# Patient Record
Sex: Female | Born: 1965 | Race: White | Hispanic: No | Marital: Married | Smoking: Never smoker
Health system: Southern US, Community
[De-identification: ages and names within clinical notes are randomized; demographics above are authoritative.]

## PROBLEM LIST (undated history)

## (undated) DIAGNOSIS — R7309 Other abnormal glucose: Secondary | ICD-10-CM

## (undated) DIAGNOSIS — T8859XA Other complications of anesthesia, initial encounter: Secondary | ICD-10-CM

## (undated) DIAGNOSIS — K589 Irritable bowel syndrome without diarrhea: Secondary | ICD-10-CM

## (undated) DIAGNOSIS — Z973 Presence of spectacles and contact lenses: Secondary | ICD-10-CM

## (undated) DIAGNOSIS — K219 Gastro-esophageal reflux disease without esophagitis: Secondary | ICD-10-CM

## (undated) DIAGNOSIS — Z8489 Family history of other specified conditions: Secondary | ICD-10-CM

## (undated) DIAGNOSIS — H269 Unspecified cataract: Secondary | ICD-10-CM

## (undated) DIAGNOSIS — C801 Malignant (primary) neoplasm, unspecified: Secondary | ICD-10-CM

## (undated) DIAGNOSIS — R112 Nausea with vomiting, unspecified: Secondary | ICD-10-CM

## (undated) DIAGNOSIS — T7840XA Allergy, unspecified, initial encounter: Secondary | ICD-10-CM

## (undated) DIAGNOSIS — F419 Anxiety disorder, unspecified: Secondary | ICD-10-CM

## (undated) DIAGNOSIS — M1990 Unspecified osteoarthritis, unspecified site: Secondary | ICD-10-CM

## (undated) DIAGNOSIS — Z9889 Other specified postprocedural states: Secondary | ICD-10-CM

## (undated) DIAGNOSIS — I10 Essential (primary) hypertension: Secondary | ICD-10-CM

## (undated) DIAGNOSIS — T4145XA Adverse effect of unspecified anesthetic, initial encounter: Secondary | ICD-10-CM

## (undated) HISTORY — PX: COLONOSCOPY: SHX174

## (undated) HISTORY — PX: WISDOM TOOTH EXTRACTION: SHX21

## (undated) HISTORY — DX: Irritable bowel syndrome, unspecified: K58.9

## (undated) HISTORY — PX: CATARACT EXTRACTION, BILATERAL: SHX1313

## (undated) HISTORY — DX: Allergy, unspecified, initial encounter: T78.40XA

## (undated) HISTORY — DX: Unspecified cataract: H26.9

## (undated) HISTORY — DX: Other abnormal glucose: R73.09

---
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
## 1989-08-15 HISTORY — PX: CHOLECYSTECTOMY: SHX55

## 1999-11-01 ENCOUNTER — Encounter: Admission: RE | Admit: 1999-11-01 | Discharge: 1999-11-01 | Payer: Self-pay | Admitting: Family Medicine

## 1999-11-01 ENCOUNTER — Encounter: Payer: Self-pay | Admitting: Family Medicine

## 2000-04-25 ENCOUNTER — Other Ambulatory Visit: Admission: RE | Admit: 2000-04-25 | Discharge: 2000-04-25 | Payer: Self-pay | Admitting: Gynecology

## 2000-09-20 ENCOUNTER — Encounter: Admission: RE | Admit: 2000-09-20 | Discharge: 2000-09-20 | Payer: Self-pay | Admitting: Gynecology

## 2000-09-20 ENCOUNTER — Encounter: Payer: Self-pay | Admitting: Gynecology

## 2001-05-28 ENCOUNTER — Other Ambulatory Visit: Admission: RE | Admit: 2001-05-28 | Discharge: 2001-05-28 | Payer: Self-pay | Admitting: Gynecology

## 2002-06-24 ENCOUNTER — Other Ambulatory Visit: Admission: RE | Admit: 2002-06-24 | Discharge: 2002-06-24 | Payer: Self-pay | Admitting: Gynecology

## 2003-07-09 ENCOUNTER — Other Ambulatory Visit: Admission: RE | Admit: 2003-07-09 | Discharge: 2003-07-09 | Payer: Self-pay | Admitting: Gynecology

## 2004-01-06 ENCOUNTER — Encounter: Admission: RE | Admit: 2004-01-06 | Discharge: 2004-01-06 | Payer: Self-pay | Admitting: Family Medicine

## 2004-01-20 ENCOUNTER — Encounter: Admission: RE | Admit: 2004-01-20 | Discharge: 2004-01-20 | Payer: Self-pay | Admitting: Family Medicine

## 2004-07-22 ENCOUNTER — Other Ambulatory Visit: Admission: RE | Admit: 2004-07-22 | Discharge: 2004-07-22 | Payer: Self-pay | Admitting: Gynecology

## 2005-08-15 HISTORY — PX: VAGINAL HYSTERECTOMY: SUR661

## 2005-10-13 ENCOUNTER — Other Ambulatory Visit: Admission: RE | Admit: 2005-10-13 | Discharge: 2005-10-13 | Payer: Self-pay | Admitting: Gynecology

## 2005-10-25 ENCOUNTER — Encounter: Admission: RE | Admit: 2005-10-25 | Discharge: 2005-10-25 | Payer: Self-pay | Admitting: Gynecology

## 2006-01-16 ENCOUNTER — Inpatient Hospital Stay (HOSPITAL_COMMUNITY): Admission: RE | Admit: 2006-01-16 | Discharge: 2006-01-18 | Payer: Self-pay | Admitting: Gynecology

## 2006-01-16 ENCOUNTER — Encounter (INDEPENDENT_AMBULATORY_CARE_PROVIDER_SITE_OTHER): Payer: Self-pay | Admitting: *Deleted

## 2006-12-14 ENCOUNTER — Encounter: Admission: RE | Admit: 2006-12-14 | Discharge: 2006-12-14 | Payer: Self-pay | Admitting: Gynecology

## 2006-12-18 ENCOUNTER — Other Ambulatory Visit: Admission: RE | Admit: 2006-12-18 | Discharge: 2006-12-18 | Payer: Self-pay | Admitting: Gynecology

## 2008-02-11 ENCOUNTER — Encounter: Admission: RE | Admit: 2008-02-11 | Discharge: 2008-02-11 | Payer: Self-pay | Admitting: Gynecology

## 2008-05-30 ENCOUNTER — Ambulatory Visit: Payer: Self-pay | Admitting: Internal Medicine

## 2008-05-30 DIAGNOSIS — R05 Cough: Secondary | ICD-10-CM

## 2008-05-30 DIAGNOSIS — R059 Cough, unspecified: Secondary | ICD-10-CM | POA: Insufficient documentation

## 2008-05-30 DIAGNOSIS — J309 Allergic rhinitis, unspecified: Secondary | ICD-10-CM

## 2008-05-30 HISTORY — DX: Cough, unspecified: R05.9

## 2008-05-30 HISTORY — DX: Allergic rhinitis, unspecified: J30.9

## 2009-03-26 ENCOUNTER — Encounter: Admission: RE | Admit: 2009-03-26 | Discharge: 2009-03-26 | Payer: Self-pay | Admitting: Gynecology

## 2010-04-21 ENCOUNTER — Encounter: Admission: RE | Admit: 2010-04-21 | Discharge: 2010-04-21 | Payer: Self-pay | Admitting: Gynecology

## 2010-05-04 ENCOUNTER — Encounter: Admission: RE | Admit: 2010-05-04 | Discharge: 2010-05-04 | Payer: Self-pay | Admitting: Gynecology

## 2010-12-31 NOTE — Discharge Summary (Signed)
NAME:  Judy Jones, Judy Jones NO.:  [id number]   MEDICAL RECORD NO.:  [id number]          PATIENT TYPE:  INP   LOCATION:  9302                          FACILITY:  [facility]   PHYSICIAN:  Gretta Cool, M.D. DATE OF BIRTH:  1966/01/03   DATE OF ADMISSION:  01/16/2006  DATE OF DISCHARGE:  01/18/2006                                 DISCHARGE SUMMARY   HISTORY OF PRESENT ILLNESS:  Judy Jones is a 45 year old female, gravida  2, para 2, with a history of a previous cesarean section delivery with her  first child.  She had a VBAC with her second child and had extensive tearing  requiring forceps delivery.  She had a much longer and difficult recovery by  her history.  This was done elsewhere. She now has had progressively severe  pelvic support issues with descent of the cervix to the introitus.  She has  a large interior cystocele and large fascial defect.  She denies urinary  incontinence but does report difficult evacuation of the bowel requiring  splinting.  She has a large fascial detachment with enterocele and rectocele  grade 3 with sphincters intact.  Pelvic neurologic findings seem to be  intact as well.  She is now admitted for symptomatic pelvic relaxation for  definitive therapy by vaginal hysterectomy, anterior colporrhaphy, and  repair of her central fascial defect post enterocele repairs, specific and  cardinal uterosacral colposuspension.  Risks and benefits have been  discussed with the patient. She accepts these procedures.   PHYSICAL EXAMINATION:  CHEST:  Clear to auscultation and percussion.  HEART: Regular rate and rhythm without murmur, gallop, cardiac enlargement.  BREASTS:  Without masses, no nipple discharge.  ABDOMEN:  Soft without masses or organomegaly.  Moderate panniculus.  PELVIC:  External genitalia within normal limits for female. Vagina has  widening of the genital hiatus with large cystocele that bulges into the  introitus with straining.  She  also has a rectocele that bulges as well  grade 3.  She has severe fascial detachment with a large enterocele and  rectocele.  The sphincter remains intact.  The uterus descends to the  introitus with traction.  Rectovaginal exam confirms.   IMPRESSION:  1.  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  2.  Severe fascial defect.  3.  Irritable bowel syndrome with chronic constipation.   PLAN:  Vaginal hysterectomy, anterior and posterior colporrhaphy with slight  specific repairs of fascial detachment and cardinal uterosacral  colposuspension.  Risks and benefits have been discussed with the patient,  and she accepts these procedures.   LABORATORY DATA:  Admission hemoglobin was 13.1, hematocrit 39.4, white  count 8.5.  On the first postoperative day, hemoglobin was 11.8, hematocrit  34.6.  The remainder of her preoperative lab work was within normal limits.  Urine pregnancy test was negative.   Her pathology report revealed cervix with chronic cervicitis with squamous  metaplasia.  No intraepithelial lesion identified, benign cervical polyp,  endometrium secretory endometrium no hyperplasia or malignancy  identified,  myometrium no malignancy identified.   HOSPITAL COURSE:  The patient underwent the above-named procedures under  general anesthesia.  The procedures were completed without any  complications, and the patient was returned to the recovery room in  excellent condition.  A banana suprapubic Cystocath was placed.  Her  postoperative course was complicated with nausea which was improved with  medication.  On the day of discharge, she was still not emptying and was  discharged with the suprapubic catheter intact.  The patient was instructed  to measure intake and output and will return to the office when the  residuals are less than 100 mL.   Her postoperative instructions included no heavy lifting or  straining, no  vaginal entrance, increased ambulation as tolerated.  She is to call for any  fever of over 100.5 or failure of daily improvement.   DIET:  Regular.   MEDICATIONS:  1.  Tylox 1-2 tablets every 6-8 hours p.r.n. discomfort.  2.  Dulcolax suppository prior to discharge and Fleet's enema if no bowel      movement.   She is to return to the office to remove the catheter when she is emptying  well and in 2 weeks for her postoperative exam.   CONDITION ON DISCHARGE:  Excellent.   FINAL DIAGNOSES:  1.  Pelvic organ prolapse with grade 3 cystocele, rectocele, and grade 3      uterine descensus.  2.  Severe fascial detachment posteriorly.   PROCEDURES:  Vaginal hysterectomy, anterior and posterior repairs, and  cardinal uterosacral colposuspension under general anesthesia.      Matt Holmes, N.P.    ______________________________  Gretta Cool, M.D.    EMK/MEDQ  D:  02/13/2006  T:  02/13/2006  Job:  [id number]

## 2010-12-31 NOTE — Op Note (Signed)
NAME:  Judy Jones, Judy Jones NO.:  [id number]   MEDICAL RECORD NO.:  [id number]          PATIENT TYPE:  AMB   LOCATION:  SDC                           FACILITY:  [facility]   PHYSICIAN:  Judy Jones, M.D. DATE OF BIRTH:  [birth year]   DATE OF PROCEDURE:  DATE OF DISCHARGE:                                 OPERATIVE REPORT   SURGEON:  Dr. Beather Arbour   ASSISTANT:  Dr. Jeanine Luz   ANESTHESIA:  General orotracheal.   PREOPERATIVE DIAGNOSES:  1.  Pelvic organ prolapse with grade 3 cystocele, rectocele, and grade 3      uterine descensus.  2.  Severe fascial detachment posteriorly.   PROCEDURE:  Vaginal hysterectomy, anterior and posterior repairs, cardinal  uterosacral colposuspension.   DESCRIPTION OF PROCEDURE:  Under excellent general anesthesia with the  patient prepped and draped in Allen stirrups the cervix was grasped with a  single tooth tenaculum and the uterus pulled down into view.  The mucosa was  then injected with 0.5% Xylocaine with epinephrine 1:200.  The mucosa was  then incised and pushed off the lower segment.  The cul-de-sac was then  entered.  The cardinal and uterosacral ligaments were then progressively  clamped, cut, sutured, and tied with 0 Vicryl.  The bladder was then pushed  off the lower segment and the vesicovaginal plica opened.  The bladder was  then placed beneath the bladder.  The uterine vessels were then clamped,  cut, sutured, and tied with 0 Vicryl.  At this point the uterus was inverted  and the adnexal pedicles clamped across.  The uterus was then excised.  The  pedicles were then doubly ligated with first a free tie of 0 Vicryl and then  a suture of 0 Vicryl.  At this point the pedicles were all dry.  The  pelvoperitoneum was then closed with a running suture of #0 Monocryl from  anterior peritoneum to lateral pedicles through cul-de-sac peritoneum.  The  anterior vaginal wall repair was then undertaken.  The mucosa was  grasped  with Allis clamps and undermined to near the urethral opening.  The mucosa  was then reflected from the underlying endopelvic fascia.  The large  endopelvic fascial defect was then first plicated with pursestring sutures  and then approximated with mattress sutures of 2-0 Vicryl.  Since she has  not been continent of urine no attempt was made to elevate her vesical neck.  At this point the mucosa was trimmed and the mucosa closed with a running  subcuticular closure of 2-0 Vicryl.  At this point the bladder pillars were  secured to the cardinal uterosacral complex.  At this point attention was  turned to the posterior repair.  Mucosa was grasped at the introitus and  dissected to the apex of the vaginal cuff.  The mucosa was then reflected  from the perirectal fascia.  A large fascial detachment was found at the  apex of the vaginal cuff.  A cardiology uterosacral colposuspension was  performed using 0 Novofil.  The complex was secured by interrupted sutures  of 0 Novofil  to the perirectal fascia and the fascia pulled all the way to  the apex of the vaginal cuff.  At this point the central portion of the  vaginal cuff was secured to the anterior vaginal fascia.  The cuff was then  closed and the mucosa trimmed.  After the site specific repair was performed  a plication of the levator fascia was performed all the way from the apex of  the vaginal cuff to the introitus.  The mucosa was then trimmed and  approximated from the apex of the cuff to the introitus.  The perineal body  muscles were then plicated in the midline and the perineal body  rebuilt.  Mucosa was closed with a subcuticular closure.  At this point the  bladder was filled with approximately 400 mL.  A banana suprapubic Cystocath  was placed and secured with 3-0 silk.  At this point procedure terminated  without complication.  Patient returned to recovery room in excellent  condition.            ______________________________  Judy Jones, M.D.     CWL/MEDQ  D:  01/16/2006  T:  01/16/2006  Job:  [id number]

## 2010-12-31 NOTE — H&P (Signed)
NAME:  Judy Jones, Judy Jones NO.:  [id number]   MEDICAL RECORD NO.:  [id number]          PATIENT TYPE:  AMB   LOCATION:  SDC                           FACILITY:  [facility]   PHYSICIAN:  Gretta Cool, M.D. DATE OF BIRTH:  18-Dec-1965   DATE OF ADMISSION:  01/16/2006  DATE OF DISCHARGE:                                HISTORY & PHYSICAL   CHIEF COMPLAINT:  Pelvic support problems.   HISTORY OF PRESENT ILLNESS:  A 45 year old, G2, P2 with history of cesarean  section delivery with her first child.  Her second delivery was vaginal.  She had extensive tearing and required forceps delivery.  She had a much  longer and difficult recovery, by her history, than the cesarean section she  had with her first delivery.  She was delivered elsewhere.  She now has had  progressively severe pelvic support issues with descent of her cervix to the  introitus.  She also has a large, anterior cystocele and large fascial  defect.  She denies any incontinence.  She also has difficult evacuation of  her bowel requiring splinting to achieve evacuation.  Often, she has very  slow bowel activity often only having bowel movements once a week.  She has  a large, fascial detachment with enterocele and rectocele grade 3.  Her anal  sphincter is intact.  Her pelvic neurologic findings also seem to be intact.  She is now admitted for symptomatic pelvic relaxation for definitive therapy  by vaginal hysterectomy, anterior colporrhaphy and repair of her central  fascial defect, posterior and enterocele repair sites specific and cardinal  uterosacral colposuspension.  She understands the risks, benefits and  alternative therapies.  She also understands she is to correct her  constipation to daily bowel habits prior to surgery and continue that  pattern after surgery.  She also understands cautions about heavy lifting or  straining ever.  She is now admitted for definitive therapy.   PAST MEDICAL HISTORY:  1.   Usual childhood diseases without sequelae.  2.  History of IBS, constipation predominant.  3.  Colonoscopy by Dr. Kinnie Scales in 1998.  4.  History of laparoscopy for pelvic pain in 1998, with suspicion for      adenomyosis.  5.  History of cholecystectomy in 1991.  6.  History of cesarean section with her first and operative vaginal      delivery in 1990, with her second elsewhere.   FAMILY HISTORY:  Mother and father both in good health.  One sister also  living and well.   SOCIAL HISTORY:  She is married and works for [employer].  Her husband  works for [employer].  They have two children living at home.  Usual  stressors including finances and children.   REVIEW OF SYSTEMS:  HEENT:  Denies symptoms.  CARDIOPULMONARY:  Denies  asthma, cough, bronchitis, shortness of breath.  GI/GU:  Denies urgency,  frequency, incontinence.  Chronic constipation as above, now well-  controlled.  History of IBS, also well-controlled.   PHYSICAL EXAMINATION:  GENERAL:  Well-developed, well-nourished, white  female, significantly over ideal  weight with BMI of 29.  HEENT:  Pupils equal round and reactive to light and accommodation.  Fundi  benign.  Oropharynx clear.  NECK:  Supple without mass or thyroid enlargement.  CHEST:  Clear to P&A.  HEART:  Regular rate and rhythm or cardiac enlargement.  BREASTS:  Without mass, nodes or nipple discharge.  ABDOMEN:  Soft without mass or organomegaly.  Moderate panniculus.  PELVIC:  External genitalia, normal female.  Vagina with widening of the  genital hiatus with large cystocele that bulges to the introitus with  straining.  She also has rectocele that bulges to the introitus, grade 3.  She has severe fascial detachment with large enterocele and rectocele.  Her  anal sphincter remains intact.  Her uterus descends to the introitus with  traction.  Rectovaginal exam confirms.  EXTREMITIES:  Negative.  NEUROLOGIC:  Negative.   IMPRESSION:  1.   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  2.  Severe fascial detachment.  3.  Irritable bowel syndrome with chronic constipation.   PLAN:  I have discussed alternative options and alternatives all recommend  vaginal hysterectomy, anterior and posterior colporrhaphy with site specific  repair of fascial detachment and cardinal uterosacral colposuspension.  She  understands risks, benefits, all options, alternatives and all major  complications.           ______________________________  Gretta Cool, M.D.     CWL/MEDQ  D:  01/13/2006  T:  01/13/2006  Job:  [id number]   cc:   [place]. Andrey Campanile, M.D.  Fax: [phone]

## 2011-01-14 ENCOUNTER — Other Ambulatory Visit: Payer: Self-pay | Admitting: Gynecology

## 2011-01-14 DIAGNOSIS — N6489 Other specified disorders of breast: Secondary | ICD-10-CM

## 2011-01-21 ENCOUNTER — Ambulatory Visit
Admission: RE | Admit: 2011-01-21 | Discharge: 2011-01-21 | Disposition: A | Discharge status: Home / Self Care | Payer: 59 | Source: Ambulatory Visit | Attending: Gynecology | Admitting: Gynecology

## 2011-01-21 DIAGNOSIS — N6489 Other specified disorders of breast: Secondary | ICD-10-CM

## 2011-06-16 ENCOUNTER — Other Ambulatory Visit: Payer: Self-pay | Admitting: Gynecology

## 2011-06-16 DIAGNOSIS — N6489 Other specified disorders of breast: Secondary | ICD-10-CM

## 2011-06-28 ENCOUNTER — Other Ambulatory Visit: Payer: Self-pay | Admitting: Gynecology

## 2011-07-29 ENCOUNTER — Ambulatory Visit
Admission: RE | Admit: 2011-07-29 | Discharge: 2011-07-29 | Disposition: A | Discharge status: Home / Self Care | Payer: 59 | Source: Ambulatory Visit | Attending: Gynecology | Admitting: Gynecology

## 2011-07-29 DIAGNOSIS — N6489 Other specified disorders of breast: Secondary | ICD-10-CM

## 2011-08-16 HISTORY — PX: EYE SURGERY: SHX253

## 2013-07-08 ENCOUNTER — Other Ambulatory Visit: Payer: Self-pay | Admitting: Orthopedic Surgery

## 2013-07-29 ENCOUNTER — Encounter (HOSPITAL_BASED_OUTPATIENT_CLINIC_OR_DEPARTMENT_OTHER): Payer: Self-pay | Admitting: *Deleted

## 2013-07-29 NOTE — Progress Notes (Signed)
Will come in for ekg and bmet-dx htn last yr-did have an ekg 2013

## 2013-07-30 ENCOUNTER — Encounter (HOSPITAL_BASED_OUTPATIENT_CLINIC_OR_DEPARTMENT_OTHER)
Admission: RE | Admit: 2013-07-30 | Discharge: 2013-07-30 | Disposition: A | Discharge status: Home / Self Care | Payer: 59 | Source: Ambulatory Visit | Attending: Orthopedic Surgery | Admitting: Orthopedic Surgery

## 2013-07-30 ENCOUNTER — Encounter (HOSPITAL_COMMUNITY): Payer: Self-pay | Admitting: Pharmacy Technician

## 2013-07-30 LAB — BASIC METABOLIC PANEL
BUN: 12 mg/dL (ref 6–23)
Calcium: 9.1 mg/dL (ref 8.4–10.5)
GFR calc non Af Amer: >90 mL/min (ref >90)
Glucose, Bld: 106 mg/dL — ABNORMAL HIGH (ref 70–99)

## 2013-07-31 ENCOUNTER — Encounter (HOSPITAL_BASED_OUTPATIENT_CLINIC_OR_DEPARTMENT_OTHER): Payer: Self-pay | Admitting: *Deleted

## 2013-07-31 MED ORDER — VANCOMYCIN HCL IN DEXTROSE 1-5 GM/200ML-% IV SOLN
1000.0000 mg | INTRAVENOUS | Status: AC
  Administered 2013-08-01: 1000 mg via INTRAVENOUS

## 2013-08-01 ENCOUNTER — Encounter (HOSPITAL_COMMUNITY): Payer: 59 | Admitting: Anesthesiology

## 2013-08-01 ENCOUNTER — Ambulatory Visit (HOSPITAL_COMMUNITY): Payer: 59 | Admitting: Anesthesiology

## 2013-08-01 ENCOUNTER — Encounter (HOSPITAL_COMMUNITY)
Admission: RE | Disposition: A | Discharge status: Home / Self Care | Payer: Self-pay | Source: Ambulatory Visit | Attending: Orthopedic Surgery

## 2013-08-01 ENCOUNTER — Ambulatory Visit (HOSPITAL_BASED_OUTPATIENT_CLINIC_OR_DEPARTMENT_OTHER)
Admission: RE | Admit: 2013-08-01 | Discharge: 2013-08-01 | Disposition: A | Discharge status: Home / Self Care | Payer: 59 | Source: Ambulatory Visit | Attending: Orthopedic Surgery | Admitting: Orthopedic Surgery

## 2013-08-01 ENCOUNTER — Encounter (HOSPITAL_COMMUNITY): Payer: Self-pay | Admitting: *Deleted

## 2013-08-01 DIAGNOSIS — G5601 Carpal tunnel syndrome, right upper limb: Secondary | ICD-10-CM

## 2013-08-01 DIAGNOSIS — G5620 Lesion of ulnar nerve, unspecified upper limb: Secondary | ICD-10-CM | POA: Insufficient documentation

## 2013-08-01 DIAGNOSIS — G5600 Carpal tunnel syndrome, unspecified upper limb: Secondary | ICD-10-CM | POA: Insufficient documentation

## 2013-08-01 DIAGNOSIS — Z01810 Encounter for preprocedural cardiovascular examination: Secondary | ICD-10-CM | POA: Insufficient documentation

## 2013-08-01 DIAGNOSIS — I10 Essential (primary) hypertension: Secondary | ICD-10-CM | POA: Insufficient documentation

## 2013-08-01 DIAGNOSIS — Z01812 Encounter for preprocedural laboratory examination: Secondary | ICD-10-CM | POA: Insufficient documentation

## 2013-08-01 DIAGNOSIS — B079 Viral wart, unspecified: Secondary | ICD-10-CM | POA: Insufficient documentation

## 2013-08-01 HISTORY — DX: Other specified postprocedural states: Z98.890

## 2013-08-01 HISTORY — PX: ULNAR NERVE TRANSPOSITION: SHX2595

## 2013-08-01 HISTORY — DX: Presence of spectacles and contact lenses: Z97.3

## 2013-08-01 HISTORY — DX: Other complications of anesthesia, initial encounter: T88.59XA

## 2013-08-01 HISTORY — DX: Adverse effect of unspecified anesthetic, initial encounter: T41.45XA

## 2013-08-01 HISTORY — DX: Gastro-esophageal reflux disease without esophagitis: K21.9

## 2013-08-01 HISTORY — DX: Essential (primary) hypertension: I10

## 2013-08-01 HISTORY — DX: Anxiety disorder, unspecified: F41.9

## 2013-08-01 HISTORY — DX: Unspecified osteoarthritis, unspecified site: M19.90

## 2013-08-01 HISTORY — DX: Family history of other specified conditions: Z84.89

## 2013-08-01 HISTORY — DX: Other specified postprocedural states: R11.2

## 2013-08-01 LAB — CBC
HCT: 41.4 % (ref 36.0–46.0)
Hemoglobin: 14.2 g/dL (ref 12.0–15.0)
MCH: 31.4 pg (ref 26.0–34.0)
MCHC: 34.3 g/dL (ref 30.0–36.0)
MCV: 91.6 fL (ref 78.0–100.0)
Platelets: 217 10*3/uL (ref 150–400)
RDW: 12.5 % (ref 11.5–15.5)
WBC: 8 10*3/uL (ref 4.0–10.5)

## 2013-08-01 SURGERY — ULNAR NERVE DECOMPRESSION/TRANSPOSITION
Anesthesia: General | Site: Hand | Laterality: Right

## 2013-08-01 SURGERY — ULNAR NERVE DECOMPRESSION/TRANSPOSITION
Anesthesia: General | Laterality: Right

## 2013-08-01 MED ORDER — LACTATED RINGERS IV SOLN
INTRAVENOUS | Status: DC
  Administered 2013-08-01: 20 mL/h via INTRAVENOUS

## 2013-08-01 MED ORDER — TAPENTADOL HCL 50 MG PO TABS: 50.0000 mg | ORAL_TABLET | ORAL | Status: DC | PRN

## 2013-08-01 MED ORDER — FENTANYL CITRATE 0.05 MG/ML IJ SOLN
INTRAMUSCULAR | Status: DC | PRN
  Administered 2013-08-01: 100 ug via INTRAVENOUS

## 2013-08-01 MED ORDER — MIDAZOLAM HCL 5 MG/5ML IJ SOLN
INTRAMUSCULAR | Status: DC | PRN
  Administered 2013-08-01: 1 mg via INTRAVENOUS

## 2013-08-01 MED ORDER — BUPIVACAINE HCL (PF) 0.25 % IJ SOLN
INTRAMUSCULAR | Status: AC
  Filled 2013-08-01: qty 30

## 2013-08-01 MED ORDER — SCOPOLAMINE 1 MG/3DAYS TD PT72
MEDICATED_PATCH | TRANSDERMAL | Status: AC
  Administered 2013-08-01: 1 via TRANSDERMAL
  Filled 2013-08-01: qty 1

## 2013-08-01 MED ORDER — MIDAZOLAM HCL 2 MG/2ML IJ SOLN
INTRAMUSCULAR | Status: AC
  Administered 2013-08-01: 2 mg
  Filled 2013-08-01: qty 2

## 2013-08-01 MED ORDER — PROMETHAZINE HCL 25 MG/ML IJ SOLN
INTRAMUSCULAR | Status: AC
  Filled 2013-08-01: qty 1

## 2013-08-01 MED ORDER — ONDANSETRON HCL 4 MG/2ML IJ SOLN
INTRAMUSCULAR | Status: DC | PRN
  Administered 2013-08-01: 4 mg via INTRAVENOUS

## 2013-08-01 MED ORDER — DEXAMETHASONE SODIUM PHOSPHATE 10 MG/ML IJ SOLN
INTRAMUSCULAR | Status: DC | PRN
  Administered 2013-08-01: 6 mg

## 2013-08-01 MED ORDER — PROMETHAZINE HCL 25 MG/ML IJ SOLN
6.2500 mg | INTRAMUSCULAR | Status: DC | PRN
  Administered 2013-08-01: 12.5 mg via INTRAVENOUS

## 2013-08-01 MED ORDER — LIDOCAINE HCL (CARDIAC) 20 MG/ML IV SOLN
INTRAVENOUS | Status: DC | PRN
  Administered 2013-08-01: 60 mg via INTRAVENOUS

## 2013-08-01 MED ORDER — DEXAMETHASONE SODIUM PHOSPHATE 4 MG/ML IJ SOLN
INTRAMUSCULAR | Status: DC | PRN
  Administered 2013-08-01: 4 mg via INTRAVENOUS

## 2013-08-01 MED ORDER — PROPOFOL 10 MG/ML IV BOLUS
INTRAVENOUS | Status: DC | PRN
  Administered 2013-08-01: 200 mg via INTRAVENOUS

## 2013-08-01 MED ORDER — BUPIVACAINE-EPINEPHRINE PF 0.5-1:200000 % IJ SOLN
INTRAMUSCULAR | Status: DC | PRN
  Administered 2013-08-01: 150 mg via PERINEURAL

## 2013-08-01 MED ORDER — 0.9 % SODIUM CHLORIDE (POUR BTL) OPTIME
TOPICAL | Status: DC | PRN
  Administered 2013-08-01: 1000 mL

## 2013-08-01 MED ORDER — VANCOMYCIN HCL IN DEXTROSE 1-5 GM/200ML-% IV SOLN
INTRAVENOUS | Status: AC
  Filled 2013-08-01: qty 200

## 2013-08-01 MED ORDER — FENTANYL CITRATE 0.05 MG/ML IJ SOLN
INTRAMUSCULAR | Status: AC
  Administered 2013-08-01: 100 ug
  Filled 2013-08-01: qty 2

## 2013-08-01 MED ORDER — CHLORHEXIDINE GLUCONATE 4 % EX LIQD: 60.0000 mL | Freq: Once | CUTANEOUS | Status: DC

## 2013-08-01 MED ORDER — LACTATED RINGERS IV SOLN
INTRAVENOUS | Status: DC | PRN
  Administered 2013-08-01 (×2): via INTRAVENOUS

## 2013-08-01 MED ORDER — HYDROMORPHONE HCL PF 1 MG/ML IJ SOLN: 0.2500 mg | INTRAMUSCULAR | Status: DC | PRN

## 2013-08-01 SURGICAL SUPPLY — 47 items
BANDAGE ELASTIC 3 VELCRO ST LF (GAUZE/BANDAGES/DRESSINGS) ×2 IMPLANT
BANDAGE ELASTIC 4 VELCRO ST LF (GAUZE/BANDAGES/DRESSINGS) IMPLANT
BANDAGE GAUZE ELAST BULKY 4 IN (GAUZE/BANDAGES/DRESSINGS) ×2 IMPLANT
BNDG COHESIVE 1X5 TAN STRL LF (GAUZE/BANDAGES/DRESSINGS) ×2 IMPLANT
CLOTH BEACON ORANGE TIMEOUT ST (SAFETY) IMPLANT
CORDS BIPOLAR (ELECTRODE) ×2 IMPLANT
COVER SURGICAL LIGHT HANDLE (MISCELLANEOUS) ×2 IMPLANT
CUFF TOURNIQUET SINGLE 18IN (TOURNIQUET CUFF) ×2 IMPLANT
CUFF TOURNIQUET SINGLE 24IN (TOURNIQUET CUFF) IMPLANT
DECANTER SPIKE VIAL GLASS SM (MISCELLANEOUS) IMPLANT
DRAPE OEC MINIVIEW 54X84 (DRAPES) IMPLANT
DRAPE SURG 17X23 STRL (DRAPES) ×2 IMPLANT
DRSG ADAPTIC 3X8 NADH LF (GAUZE/BANDAGES/DRESSINGS) ×2 IMPLANT
GAUZE XEROFORM 1X8 LF (GAUZE/BANDAGES/DRESSINGS) IMPLANT
GAUZE XEROFORM 5X9 LF (GAUZE/BANDAGES/DRESSINGS) ×2 IMPLANT
GLOVE BIOGEL M STRL SZ7.5 (GLOVE) ×2 IMPLANT
GLOVE SS BIOGEL STRL SZ 8 (GLOVE) ×1 IMPLANT
GLOVE SUPERSENSE BIOGEL SZ 8 (GLOVE) ×1
GOWN SRG XL XLNG 56XLVL 4 (GOWN DISPOSABLE) ×3 IMPLANT
GOWN STRL NON-REIN LRG LVL3 (GOWN DISPOSABLE) ×4 IMPLANT
GOWN STRL NON-REIN XL XLG LVL4 (GOWN DISPOSABLE) ×3
KIT BASIN OR (CUSTOM PROCEDURE TRAY) ×2 IMPLANT
KIT ROOM TURNOVER OR (KITS) ×2 IMPLANT
MANIFOLD NEPTUNE II (INSTRUMENTS) IMPLANT
NEEDLE HYPO 25GX1X1/2 BEV (NEEDLE) ×2 IMPLANT
NS IRRIG 1000ML POUR BTL (IV SOLUTION) ×2 IMPLANT
PACK ORTHO EXTREMITY (CUSTOM PROCEDURE TRAY) ×2 IMPLANT
PAD ARMBOARD 7.5X6 YLW CONV (MISCELLANEOUS) ×4 IMPLANT
PAD CAST 3X4 CTTN HI CHSV (CAST SUPPLIES) ×2 IMPLANT
PAD CAST 4YDX4 CTTN HI CHSV (CAST SUPPLIES) IMPLANT
PADDING CAST COTTON 3X4 STRL (CAST SUPPLIES) ×2
PADDING CAST COTTON 4X4 STRL (CAST SUPPLIES)
SOLUTION BETADINE 4OZ (MISCELLANEOUS) ×2 IMPLANT
SPECIMEN JAR SMALL (MISCELLANEOUS) IMPLANT
SPONGE GAUZE 4X4 12PLY (GAUZE/BANDAGES/DRESSINGS) ×2 IMPLANT
SPONGE SCRUB IODOPHOR (GAUZE/BANDAGES/DRESSINGS) ×4 IMPLANT
SUCTION FRAZIER TIP 10 FR DISP (SUCTIONS) ×2 IMPLANT
SUT MERSILENE 4 0 P 3 (SUTURE) IMPLANT
SUT PROLENE 4 0 PS 2 18 (SUTURE) ×6 IMPLANT
SUT VIC AB 2-0 CT1 27 (SUTURE)
SUT VIC AB 2-0 CT1 TAPERPNT 27 (SUTURE) IMPLANT
SYR CONTROL 10ML LL (SYRINGE) ×2 IMPLANT
TOWEL OR 17X24 6PK STRL BLUE (TOWEL DISPOSABLE) ×4 IMPLANT
TOWEL OR 17X26 10 PK STRL BLUE (TOWEL DISPOSABLE) ×2 IMPLANT
TUBE CONNECTING 12X1/4 (SUCTIONS) ×2 IMPLANT
UNDERPAD 30X30 INCONTINENT (UNDERPADS AND DIAPERS) ×2 IMPLANT
WATER STERILE IRR 1000ML POUR (IV SOLUTION) IMPLANT

## 2013-08-01 NOTE — Anesthesia Preprocedure Evaluation (Addendum)
Anesthesia Evaluation  Patient identified by MRN, date of birth, ID band Patient awake    Reviewed: Allergy & Precautions, H&P , NPO status , Patient's Chart, lab work & pertinent test results  History of Anesthesia Complications (+) PONV and history of anesthetic complications  Airway Mallampati: II TM Distance: >3 FB Neck ROM: Full    Dental  (+) Teeth Intact and Dental Advisory Given   Pulmonary neg pulmonary ROS,    Pulmonary exam normal       Cardiovascular hypertension, Pt. on medications     Neuro/Psych negative neurological ROS     GI/Hepatic Neg liver ROS, GERD-  Medicated,  Endo/Other  negative endocrine ROS  Renal/GU negative Renal ROS     Musculoskeletal   Abdominal   Peds  Hematology   Anesthesia Other Findings   Reproductive/Obstetrics                          Anesthesia Physical Anesthesia Plan  ASA: II  Anesthesia Plan: General   Post-op Pain Management:    Induction: Intravenous  Airway Management Planned: LMA  Additional Equipment:   Intra-op Plan:   Post-operative Plan: Extubation in OR  Informed Consent: I have reviewed the patients History and Physical, chart, labs and discussed the procedure including the risks, benefits and alternatives for the proposed anesthesia with the patient or authorized representative who has indicated his/her understanding and acceptance.   Dental advisory given  Plan Discussed with: CRNA, Anesthesiologist and Surgeon  Anesthesia Plan Comments:        Anesthesia Quick Evaluation

## 2013-08-01 NOTE — H&P (Signed)
KEIKO MYRICKS is an 47 y.o. female.   Chief Complaint: Numbness in hand HPI: Patient is a very pleasant 47 year old female who presents to the hospital setting for surgical intervention for her right upper extremity patient has a history of carpal and cubital tunnel syndrome, no studies revealed mild right carpal tunnel syndrome as well as a mild right ulnar neuropathy. Patient is felt conservative measures and would like to proceed with surgical intervention. I should note that she in addition has a painful right middle finger mass which we discussed at length she would also like to consider excision of the mass.  Past Medical History  Diagnosis Date  . Anxiety   . GERD (gastroesophageal reflux disease)   . Arthritis   . Wears glasses   . Hypertension   . Complication of anesthesia   . PONV (postoperative nausea and vomiting)   . Family history of anesthesia complication     Mother get sick on her stomach    Past Surgical History  Procedure Laterality Date  . Wisdom tooth extraction    . Cholecystectomy  1991  . Cesarean section  1985  . Eye surgery  2013    both cataracts  . Vaginal hysterectomy  2007    ant/post repair  . Colonoscopy      Hx: of    Family History  Problem Relation Age of Onset  . Hypertension Mother   . Bipolar disorder Other    Social History:  reports that she has never smoked. She has never used smokeless tobacco. She reports that she drinks alcohol. She reports that she does not use illicit drugs.  Allergies:  Allergies  Allergen Reactions  . Shellfish Allergy Anaphylaxis  . Morphine Hives  . Penicillins Other (See Comments)    Childhood reaction: possibly hives   . Percocet [Oxycodone-Acetaminophen] Nausea And Vomiting    Medications Prior to Admission  Medication Sig Dispense Refill  . ALPRAZolam (XANAX) 0.25 MG tablet Take 0.25 mg by mouth daily as needed for anxiety.       . Cholecalciferol (VITAMIN D) 2000 UNITS CAPS Take 2,000 Units  by mouth daily.      Marland Kitchen esomeprazole (NEXIUM) 40 MG capsule Take 40 mg by mouth every evening.      . loratadine (CLARITIN) 10 MG tablet Take 10 mg by mouth daily.      Marland Kitchen losartan (COZAAR) 100 MG tablet Take 100 mg by mouth daily.        Results for orders placed during the hospital encounter of 08/01/13 (from the past 48 hour(s))  CBC     Status: None   Collection Time    08/01/13  1:31 PM      Result Value Range   WBC 8.0  4.0 - 10.5 K/uL   RBC 4.52  3.87 - 5.11 MIL/uL   Hemoglobin 14.2  12.0 - 15.0 g/dL   HCT 40.9  81.1 - 91.4 %   MCV 91.6  78.0 - 100.0 fL   MCH 31.4  26.0 - 34.0 pg   MCHC 34.3  30.0 - 36.0 g/dL   RDW 78.2  95.6 - 21.3 %   Platelets 217  150 - 400 K/uL   No results found.  Review of Systems  Constitutional: Negative.   HENT: Negative.   Eyes: Negative.   Respiratory: Negative.   Cardiovascular: Negative.   Gastrointestinal: Negative.   Genitourinary: Negative.   Musculoskeletal:       History of present illness  Skin:  Negative.   Neurological: Negative.   Endo/Heme/Allergies: Negative.     Blood pressure 153/77, pulse 105, temperature 98.2 F (36.8 C), temperature source Oral, resp. rate 13, height 5\' 5"  (1.651 m), weight 80.74 kg (178 lb), SpO2 100.00%. Physical Exam  ..The patient is alert and oriented in no acute distress the patient complains of pain in the affected upper extremity.  The patient is noted to have a normal HEENT exam.  Lung fields show equal chest expansion and no shortness of breath  abdomen exam is nontender without distention.  Lower extremity examination does not show any fracture dislocation or blood clot symptoms.  Pelvis is stable neck and back are stable and nontender Throughout the extremity currently shows that the patient had a preoperative block and has dense numbness present prior to examination grossly shows a positive cubital tunnel compression and Tinel's as well as a positive median nerve compression, Tinel's,  Phalen's. She has no intrinsic wasting, APB fires nicely, there is no thenar atrophy present.  Assessment/Plan Right carpal tunnel syndrome, right cubital tunnel syndrome Right middle finger mass .Marland KitchenWe are planning surgery for your upper extremity. The risk and benefits of surgery include risk of bleeding infection anesthesia damage to normal structures and failure of the surgery to accomplish its intended goals of relieving symptoms and restoring function with this in mind we'll going to proceed. I have specifically discussed with the patient the pre-and postoperative regime and the does and don'ts and risk and benefits in great detail. Risk and benefits of surgery also include risk of dystrophy chronic nerve pain failure of the healing process to go onto completion and other inherent risks of surgery The relavent the pathophysiology of the disease/injury process, as well as the alternatives for treatment and postoperative course of action has been discussed in great detail with the patient who desires to proceed.  We will do everything in our power to help you (the patient) restore function to the upper extremity. Is a pleasure to see this patient today.    Shanee Batch L 08/01/2013, 5:24 PM

## 2013-08-01 NOTE — Transfer of Care (Signed)
Immediate Anesthesia Transfer of Care Note  Patient: Judy Jones  Procedure(s) Performed: Procedure(s): RIGHT LIMITED CARPAL TUNNEL RELEASE,RIGHT CUBITAL TUNNEL RELEASE; right cubital tunnel release INSITU ,EXCISION RIGHT MIDDLE FINGER MASS (Right)  Patient Location: PACU  Anesthesia Type:General  Level of Consciousness: awake, alert  and oriented  Airway & Oxygen Therapy: Patient Spontanous Breathing and Patient connected to nasal cannula oxygen  Post-op Assessment: Report given to PACU RN, Post -op Vital signs reviewed and stable and Patient moving all extremities  Post vital signs: Reviewed and stable  Complications: No apparent anesthesia complications

## 2013-08-01 NOTE — Op Note (Signed)
See dictation #[id number] Amanda Pea MD

## 2013-08-01 NOTE — Anesthesia Postprocedure Evaluation (Signed)
  Anesthesia Post-op Note  Patient: Judy Jones  Procedure(s) Performed: Procedure(s): RIGHT LIMITED CARPAL TUNNEL RELEASE,RIGHT CUBITAL TUNNEL RELEASE; right cubital tunnel release INSITU ,EXCISION RIGHT MIDDLE FINGER MASS (Right)  Patient Location: PACU  Anesthesia Type:GA combined with regional for post-op pain  Level of Consciousness: awake, oriented, sedated and patient cooperative  Airway and Oxygen Therapy: Patient Spontanous Breathing  Post-op Pain: none  Post-op Assessment: Post-op Vital signs reviewed, Patient's Cardiovascular Status Stable, Respiratory Function Stable, Patent Airway, No signs of Nausea or vomiting and Pain level controlled  Post-op Vital Signs: stable  Complications: No apparent anesthesia complications

## 2013-08-01 NOTE — Anesthesia Procedure Notes (Addendum)
Anesthesia Regional Block:  Supraclavicular block  Pre-Anesthetic Checklist: ,, timeout performed, Correct Patient, Correct Site, Correct Laterality, Correct Procedure, Correct Position, site marked, Risks and benefits discussed,  Surgical consent,  Pre-op evaluation,  At surgeon's request and post-op pain management  Laterality: Right  Prep: chloraprep       Needles:  Injection technique: Single-shot  Needle Type: Echogenic Stimulator Needle     Needle Length: 5cm 5 cm Needle Gauge: 22 and 22 G    Additional Needles:  Procedures: ultrasound guided (picture in chart) and nerve stimulator Supraclavicular block  Nerve Stimulator or Paresthesia:  Response: bicep contraction, 0.45 mA,   Additional Responses:   Narrative:  Start time: 08/01/2013 2:51 PM End time: 08/01/2013 3:01 PM Injection made incrementally with aspirations every 5 mL.  Performed by: Personally  Anesthesiologist: J. Adonis Huguenin, MD  Additional Notes: Functioning IV was confirmed and monitors applied.  A 50mm 22ga echogenic arrow stimulator was used. Sterile prep and drape,hand hygiene and sterile gloves were used.Ultrasound guidance: relevant anatomy identified, needle position confirmed, local anesthetic spread visualized around nerve(s)., vascular puncture avoided.  Image printed for medical record.  Negative aspiration and negative test dose prior to incremental administration of local anesthetic. The patient tolerated the procedure well.

## 2013-08-02 NOTE — Op Note (Signed)
Judy Jones, WESTERGAARD NO.:  [id number]  MEDICAL RECORD NO.:  [id number]  LOCATION:  [facility]                         FACILITY:  [facility]  PHYSICIAN:  Dionne Ano. Casin Federici, M.D.DATE OF BIRTH:  10-May-1966  DATE OF PROCEDURE: DATE OF DISCHARGE:  08/01/2013                              OPERATIVE REPORT   PREOPERATIVE DIAGNOSES: 1. Carpal and cubital tunnel syndrome, right upper extremity. 2. Right middle finger chronic mass.  POSTOPERATIVE DIAGNOSES: 1. Carpal and cubital tunnel syndrome, right upper extremity. 2. Right middle finger chronic mass.  PROCEDURE: 1. Right open carpal tunnel release. 2. Right cubital tunnel release at the elbow in situ (decompression of     the ulnar nerve at the elbow), right upper extremity. 3. Right deep mass removal from the right middle finger.  SURGEON:  Dionne Ano. Amanda Pea, M.D.  ASSISTANT:  Karie Chimera, P.A.C.  COMPLICATIONS:  None.  ANESTHESIA:  General with preoperative block.  TOURNIQUET TIME:  Less than an hour.  INDICATIONS:  The patient is a pleasant female who presents with the above-mentioned diagnosis.  I have counseled her in regard to risks and benefits of surgery and she desires to proceed.  We went over all risks and benefits, do's and don'ts, time frame duration of recovery, and other issues to remainder of her extremity.  The patient has what appears to be a deep verrucae.  She asked me to excise this as she has failed numerous attempts at removal.  We are going to plan for the deep mass removal about the middle finger. She has associated verrucae about the index finger, but these are not problematic.  DESCRIPTION OF PROCEDURE:  The patient was seen by myself and Anesthesia, taken to the operative suite, underwent smooth induction of general anesthesia.  Preoperative block was placed, time-out was called. Arm marked, pre and postop check was complete, and following this, we commenced with sterile prep and  drape.  Once this was complete, we then elevated the tourniquet.  We made a 1.5 cm incision __________ transverse carpal ligament.  Dissection was carried down with knife blade.  Palmar fascia incised.  This was released under 4.0 loupe magnification.  Fat pad egressed nicely.  Following this, distal proximal dissection was carried out until adequate room was available for __________ followed by blunt tip scissors being placed the remaining proximal leaflet.  I did this under direct 4.0 loupe magnification. Full release was accomplished, median nerve was hyperemic, had evidence of compression given the bulbous nature of the nerve and thickness of the carpal canal.  The patient tolerated this well.  We irrigated copiously and then closed the wound with 1 stitch later in the operation, was secured hemostasis, and closed the remaining features with tourniquet deflated.  Following this, a curvilinear and posterior medial incision was made. Dissection was carried down.  Once this was done, we identified the ulnar nerve proximally.  Portions of the arcade of Strut[place] were released followed by release of the medial intermuscular septum, cubital tunnel, Osborne ligament, and 2 heads of the FCU.  The ulnar nerve was released without difficulty.  There were no complications and had no subluxation tendency, and I was careful to window  through the subcutaneous tissue, so as to give her a nice buffer against the incision.  Once this was complete, we irrigated copiously and closed the wound with Prolene with tourniquet deflated.  We then closed the carpal tunnel wound.  All looked quite well.  At this time, with tourniquet deflated, we performed a football shaped incision over the mass in question.  This was removed about the skin and subcutaneous tissue.  We made sure to remove any seed-like areas, and the patient tolerated this well.  The mass was not sent for specimen as it was felt to be  benign.  Following this, it was closed with Prolene of the 4-0 variety. Dressings were placed.  These were soft dressings.  The patient tolerated this well.  I am going to have her return to the office and see me on December 24th for simple dressing change.  Should any problems arise, she will notify me.  These notes have been discussed.  All questions have been encouraged and answered.  It was an absolute pleasure to participate in her care.  We are going to discharge her from the hospital tonight.  We are going to ask __________ should any problems occur.  She did have preoperative vancomycin.  We will plan for Nucynta for pain relief postoperatively.     Dionne Ano. Amanda Pea, M.D.     [facility]  D:  08/01/2013  T:  08/02/2013  Job:  [id number]

## 2013-08-06 ENCOUNTER — Encounter (HOSPITAL_COMMUNITY): Payer: Self-pay | Admitting: Orthopedic Surgery

## 2014-01-24 ENCOUNTER — Other Ambulatory Visit: Payer: Self-pay

## 2014-01-24 DIAGNOSIS — Z1231 Encounter for screening mammogram for malignant neoplasm of breast: Secondary | ICD-10-CM

## 2014-01-27 ENCOUNTER — Ambulatory Visit
Admission: RE | Admit: 2014-01-27 | Discharge: 2014-01-27 | Disposition: A | Discharge status: Home / Self Care | Payer: 59 | Source: Ambulatory Visit

## 2014-01-27 DIAGNOSIS — Z1231 Encounter for screening mammogram for malignant neoplasm of breast: Secondary | ICD-10-CM

## 2014-04-18 ENCOUNTER — Telehealth: Payer: Self-pay | Admitting: Obstetrics and Gynecology

## 2014-04-18 NOTE — Telephone Encounter (Signed)
Confirming pts appt °

## 2014-04-23 ENCOUNTER — Ambulatory Visit (INDEPENDENT_AMBULATORY_CARE_PROVIDER_SITE_OTHER): Payer: 59 | Admitting: Obstetrics and Gynecology

## 2014-04-23 ENCOUNTER — Encounter: Payer: Self-pay | Admitting: Obstetrics and Gynecology

## 2014-04-23 VITALS — BP 120/84 | HR 90 | Resp 16 | Ht 65.0 in | Wt 185.6 lb

## 2014-04-23 DIAGNOSIS — N393 Stress incontinence (female) (male): Secondary | ICD-10-CM

## 2014-04-23 DIAGNOSIS — Z0000 Encounter for general adult medical examination without abnormal findings: Secondary | ICD-10-CM

## 2014-04-23 DIAGNOSIS — Z01419 Encounter for gynecological examination (general) (routine) without abnormal findings: Secondary | ICD-10-CM

## 2014-04-23 HISTORY — DX: Stress incontinence (female) (male): N39.3

## 2014-04-23 LAB — POCT URINALYSIS DIPSTICK
Bilirubin, UA: NEGATIVE
Blood, UA: NEGATIVE
Glucose, UA: NEGATIVE
Ketones, UA: NEGATIVE
LEUKOCYTES UA: NEGATIVE
NITRITE UA: NEGATIVE
PROTEIN UA: NEGATIVE
Urobilinogen, UA: NEGATIVE
pH, UA: 5

## 2014-04-23 MED ORDER — NYSTATIN-TRIAMCINOLONE 100000-0.1 UNIT/GM-% EX OINT: 1.0000 "application " | TOPICAL_OINTMENT | Freq: Two times a day (BID) | CUTANEOUS | Status: DC

## 2014-04-23 NOTE — Progress Notes (Addendum)
Patient ID: Judy Jones, female   DOB: [birth year], 48 y.o.   MRN: [id number] GYNECOLOGY VISIT  PCP:  Antony Contras, MD  Referring provider:   HPI: 48 y.o.   Married  Caucasian  female   No obstetric history on file. with Patient's last menstrual period was 01/13/2006.   here for  New Gyn--Annual exam.  Some night sweats just starting.  Manageable.   Had TVH with anterior and posterior colporrhaphy.  Still has tubes and ovaries.  Leaks urine with cough and sneeze. And laugh and exercise.  Uses Poise pads due to loss of control. Takes Losartan so voids often.  NF - once per night.  Some irritation with bowel movements. Perianal irritation.  Uses Mycolog ointment. Needs refill.   Hgb:    PCP. Will do labs next week with PCP.  Urine:  Neg  GYNECOLOGIC HISTORY: Patient's last menstrual period was 01/13/2006. Sexually active:  yes Partner preference: female Contraception: Vasectomy/ Hysterectomy  Menopausal hormones:no   Blood transfusions: no    Sexually transmitted diseases: no GYN procedures and prior surgeries:  Laparoscopy to r/o endometriosis, C-section x1, TVH with A & P repair 01/2006 with Dr. Ubaldo Glassing due to heavy menses. Last mammogram:  01-27-14 heterogeneously dense, otherwise normal:[facility].            Last pap and high risk HPV testing: 2012 normal   History of abnormal pap smear: no     OB History   Grav Para Term Preterm Abortions TAB SAB Ect Mult Living   2 2 2       2        LIFESTYLE: Exercise:   no             OTHER HEALTH MAINTENANCE: Tetanus/TDap:  Up to date with PCP HPV:                   n/a Influenza:           05/2013   Bone density:   n/a Colonoscopy:  Years ago due to abdominal pain--normal with [facility].  Patient is now due for screening colonoscopy.  Cholesterol check:   Normal with PCP   Family History  Problem Relation Age of Onset  . Hypertension Mother   . Bipolar disorder Other     Patient Active Problem List   Diagnosis Date Noted  . ALLERGIC RHINITIS 05/30/2008  . COUGH 05/30/2008   Past Medical History  Diagnosis Date  . Anxiety   . GERD (gastroesophageal reflux disease)   . Arthritis   . Wears glasses   . Hypertension   . Complication of anesthesia   . PONV (postoperative nausea and vomiting)   . Family history of anesthesia complication     Mother get sick on her stomach    Past Surgical History  Procedure Laterality Date  . Wisdom tooth extraction    . Cholecystectomy  1991  . Cesarean section  1985  . Eye surgery  2013    both cataracts  . Vaginal hysterectomy  2007    ant/post repair  . Colonoscopy      Hx: of  . Ulnar nerve transposition Right 08/01/2013    Procedure: RIGHT LIMITED CARPAL TUNNEL RELEASE,RIGHT CUBITAL TUNNEL RELEASE; right cubital tunnel release INSITU ,EXCISION RIGHT MIDDLE FINGER MASS;  Surgeon: Roseanne Kaufman, MD;  Location: [place];  Service: Orthopedics;  Laterality: Right;    ALLERGIES: Shellfish allergy; Morphine; Penicillins; and Percocet  Current Outpatient Prescriptions  Medication Sig  Dispense Refill  . ALPRAZolam (XANAX) 0.25 MG tablet Take 0.25 mg by mouth daily as needed for anxiety.       . Cholecalciferol (VITAMIN D) 2000 UNITS CAPS Take 2,000 Units by mouth daily.      Marland Kitchen esomeprazole (NEXIUM) 40 MG capsule Take 40 mg by mouth every evening.      . loratadine (CLARITIN) 10 MG tablet Take 10 mg by mouth daily.      Marland Kitchen losartan (COZAAR) 100 MG tablet Take 100 mg by mouth daily.       No current facility-administered medications for this visit.     ROS:  Pertinent items are noted in HPI.  History   Social History  . Marital Status: Married    Spouse Name: N/A    Number of Children: N/A  . Years of Education: N/A   Occupational History  . Not on file.   Social History Main Topics  . Smoking status: Never Smoker   . Smokeless tobacco: Never Used  . Alcohol Use: 0.5 oz/week    1 drink(s) per week     Comment: occ  . Drug Use: No   . Sexual Activity: Yes    Partners: Male    Birth Control/ Protection: Surgical     Comment: TVH--ovaries remain   Other Topics Concern  . Not on file   Social History Narrative  . No narrative on file    PHYSICAL EXAMINATION:    BP 120/84  Pulse 90  Resp 16  Ht 5\' 5"  (1.651 m)  Wt 185 lb 9.6 oz (84.188 kg)  BMI 30.89 kg/m2  LMP 01/13/2006   Wt Readings from Last 3 Encounters:  04/23/14 185 lb 9.6 oz (84.188 kg)  08/01/13 178 lb (80.74 kg)  08/01/13 178 lb (80.74 kg)     Ht Readings from Last 3 Encounters:  04/23/14 5\' 5"  (1.651 m)  08/01/13 5\' 5"  (1.651 m)  08/01/13 5\' 5"  (1.651 m)    General appearance: alert, cooperative and appears stated age Head: Normocephalic, without obvious abnormality, atraumatic Neck: no adenopathy, supple, symmetrical, trachea midline and thyroid not enlarged, symmetric, no tenderness/mass/nodules Lungs: clear to auscultation bilaterally Breasts: Inspection negative, No nipple retraction or dimpling, No nipple discharge or bleeding, No axillary or supraclavicular adenopathy, Normal to palpation without dominant masses Heart: regular rate and rhythm Abdomen: soft, non-tender; no masses,  no organomegaly Extremities: extremities normal, atraumatic, no cyanosis or edema Skin: Skin color, texture, turgor normal. No rashes or lesions Lymph nodes: Cervical, supraclavicular, and axillary nodes normal. No abnormal inguinal nodes palpated Neurologic: Grossly normal  Pelvic: External genitalia:  no lesions              Urethra:  normal appearing urethra with no masses, tenderness or lesions              Bartholins and Skenes: normal                 Vagina: normal appearing vagina with normal color and discharge, no lesions, minimal anterior wall prolapse with valsalva.              Cervix:  absent              Pap and high risk HPV testing done: No.        Bimanual Exam:  Uterus:   absent  Adnexa: normal  adnexa in size, nontender and no masses                                      Rectovaginal:  Yes.                                        Confirms above.                                      Anus:  normal sphincter tone, no lesions  ASSESSMENT  Normal gynecologic exam. Status post TVH and anterior and posterior colporrhaphy.  Genuine stress incontinence.  Perineal irritation.   PLAN  Mammogram recommended yearly starting at age 17. Pap smear and high risk HPV testing as above. Counseled on self breast exam, Calcium and vitamin D intake, exercise. See lab orders: No. Mycolog II ointment prn.  Discussion and handout on urinary incontinence and surgery for stress incontinence.  Patient will consider.  Return annually or prn   An After Visit Summary was printed and given to the patient.

## 2014-04-23 NOTE — Patient Instructions (Signed)

## 2014-05-12 NOTE — Telephone Encounter (Signed)
Pt has records to pick up. Unable to leave message vm not set up

## 2014-05-14 NOTE — Telephone Encounter (Signed)
Pt aware and will pick up records in drawer by 05/21/14

## 2014-06-16 ENCOUNTER — Encounter: Payer: Self-pay | Admitting: Obstetrics and Gynecology

## 2014-09-07 ENCOUNTER — Other Ambulatory Visit: Payer: Self-pay | Admitting: Orthopedic Surgery

## 2014-10-09 ENCOUNTER — Other Ambulatory Visit: Payer: Self-pay | Admitting: Dermatology

## 2014-10-21 ENCOUNTER — Encounter (HOSPITAL_BASED_OUTPATIENT_CLINIC_OR_DEPARTMENT_OTHER): Payer: Self-pay | Admitting: *Deleted

## 2014-10-22 ENCOUNTER — Encounter (HOSPITAL_BASED_OUTPATIENT_CLINIC_OR_DEPARTMENT_OTHER)
Admission: RE | Admit: 2014-10-22 | Discharge: 2014-10-22 | Disposition: A | Discharge status: Home / Self Care | Payer: 59 | Source: Ambulatory Visit | Attending: Orthopedic Surgery | Admitting: Orthopedic Surgery

## 2014-10-22 DIAGNOSIS — Z9889 Other specified postprocedural states: Secondary | ICD-10-CM | POA: Diagnosis not present

## 2014-10-22 DIAGNOSIS — I509 Heart failure, unspecified: Secondary | ICD-10-CM | POA: Diagnosis not present

## 2014-10-22 DIAGNOSIS — G5602 Carpal tunnel syndrome, left upper limb: Secondary | ICD-10-CM | POA: Diagnosis not present

## 2014-10-22 DIAGNOSIS — G5622 Lesion of ulnar nerve, left upper limb: Secondary | ICD-10-CM | POA: Diagnosis not present

## 2014-10-22 DIAGNOSIS — Z6831 Body mass index (BMI) 31.0-31.9, adult: Secondary | ICD-10-CM | POA: Diagnosis not present

## 2014-10-22 DIAGNOSIS — M1990 Unspecified osteoarthritis, unspecified site: Secondary | ICD-10-CM | POA: Diagnosis not present

## 2014-10-22 DIAGNOSIS — F419 Anxiety disorder, unspecified: Secondary | ICD-10-CM | POA: Diagnosis not present

## 2014-10-22 DIAGNOSIS — Z79899 Other long term (current) drug therapy: Secondary | ICD-10-CM | POA: Diagnosis not present

## 2014-10-22 DIAGNOSIS — K219 Gastro-esophageal reflux disease without esophagitis: Secondary | ICD-10-CM | POA: Diagnosis not present

## 2014-10-22 DIAGNOSIS — I10 Essential (primary) hypertension: Secondary | ICD-10-CM | POA: Diagnosis not present

## 2014-10-22 LAB — BASIC METABOLIC PANEL
Anion gap: 6 (ref 5–15)
BUN: 17 mg/dL (ref 6–23)
CALCIUM: 9.7 mg/dL (ref 8.4–10.5)
CO2: 29 mmol/L (ref 19–32)
Chloride: 101 mmol/L (ref 96–112)
Creatinine, Ser: 0.85 mg/dL (ref 0.50–1.10)
GFR calc non Af Amer: 79 mL/min — ABNORMAL LOW (ref >90)
GLUCOSE: 88 mg/dL (ref 70–99)
Potassium: 4.3 mmol/L (ref 3.5–5.1)
SODIUM: 136 mmol/L (ref 135–145)

## 2014-10-24 ENCOUNTER — Encounter (HOSPITAL_BASED_OUTPATIENT_CLINIC_OR_DEPARTMENT_OTHER)
Admission: RE | Disposition: A | Discharge status: Home / Self Care | Payer: Self-pay | Source: Ambulatory Visit | Attending: Orthopedic Surgery

## 2014-10-24 ENCOUNTER — Ambulatory Visit (HOSPITAL_BASED_OUTPATIENT_CLINIC_OR_DEPARTMENT_OTHER): Payer: 59 | Admitting: Anesthesiology

## 2014-10-24 ENCOUNTER — Encounter (HOSPITAL_BASED_OUTPATIENT_CLINIC_OR_DEPARTMENT_OTHER): Payer: Self-pay | Admitting: *Deleted

## 2014-10-24 ENCOUNTER — Ambulatory Visit (HOSPITAL_BASED_OUTPATIENT_CLINIC_OR_DEPARTMENT_OTHER)
Admission: RE | Admit: 2014-10-24 | Discharge: 2014-10-24 | Disposition: A | Discharge status: Home / Self Care | Payer: 59 | Source: Ambulatory Visit | Attending: Orthopedic Surgery | Admitting: Orthopedic Surgery

## 2014-10-24 DIAGNOSIS — I509 Heart failure, unspecified: Secondary | ICD-10-CM | POA: Insufficient documentation

## 2014-10-24 DIAGNOSIS — Z79899 Other long term (current) drug therapy: Secondary | ICD-10-CM | POA: Insufficient documentation

## 2014-10-24 DIAGNOSIS — G5602 Carpal tunnel syndrome, left upper limb: Secondary | ICD-10-CM | POA: Insufficient documentation

## 2014-10-24 DIAGNOSIS — Z6831 Body mass index (BMI) 31.0-31.9, adult: Secondary | ICD-10-CM | POA: Insufficient documentation

## 2014-10-24 DIAGNOSIS — K219 Gastro-esophageal reflux disease without esophagitis: Secondary | ICD-10-CM | POA: Insufficient documentation

## 2014-10-24 DIAGNOSIS — F419 Anxiety disorder, unspecified: Secondary | ICD-10-CM | POA: Insufficient documentation

## 2014-10-24 DIAGNOSIS — Z9889 Other specified postprocedural states: Secondary | ICD-10-CM | POA: Insufficient documentation

## 2014-10-24 DIAGNOSIS — M1990 Unspecified osteoarthritis, unspecified site: Secondary | ICD-10-CM | POA: Insufficient documentation

## 2014-10-24 DIAGNOSIS — I10 Essential (primary) hypertension: Secondary | ICD-10-CM | POA: Insufficient documentation

## 2014-10-24 DIAGNOSIS — G5622 Lesion of ulnar nerve, left upper limb: Secondary | ICD-10-CM | POA: Insufficient documentation

## 2014-10-24 HISTORY — PX: CARPAL TUNNEL RELEASE: SHX101

## 2014-10-24 HISTORY — PX: ULNAR NERVE TRANSPOSITION: SHX2595

## 2014-10-24 LAB — POCT HEMOGLOBIN-HEMACUE: Hemoglobin: 15.4 g/dL — ABNORMAL HIGH (ref 12.0–15.0)

## 2014-10-24 SURGERY — CARPAL TUNNEL RELEASE
Anesthesia: General | Site: Wrist | Laterality: Left

## 2014-10-24 MED ORDER — VANCOMYCIN HCL IN DEXTROSE 500-5 MG/100ML-% IV SOLN
INTRAVENOUS | Status: AC
  Filled 2014-10-24: qty 200

## 2014-10-24 MED ORDER — HYDROMORPHONE HCL 1 MG/ML IJ SOLN: 0.2500 mg | INTRAMUSCULAR | Status: DC | PRN

## 2014-10-24 MED ORDER — VANCOMYCIN HCL IN DEXTROSE 1-5 GM/200ML-% IV SOLN
1000.0000 mg | INTRAVENOUS | Status: AC
  Administered 2014-10-24: 1000 mg via INTRAVENOUS

## 2014-10-24 MED ORDER — CHLORHEXIDINE GLUCONATE 4 % EX LIQD: 60.0000 mL | Freq: Once | CUTANEOUS | Status: DC

## 2014-10-24 MED ORDER — FENTANYL CITRATE 0.05 MG/ML IJ SOLN: 50.0000 ug | INTRAMUSCULAR | Status: DC | PRN

## 2014-10-24 MED ORDER — DEXAMETHASONE SODIUM PHOSPHATE 4 MG/ML IJ SOLN
INTRAMUSCULAR | Status: DC | PRN
  Administered 2014-10-24: 10 mg via INTRAVENOUS

## 2014-10-24 MED ORDER — SODIUM CHLORIDE 0.45 % IV SOLN: INTRAVENOUS | Status: DC

## 2014-10-24 MED ORDER — MIDAZOLAM HCL 5 MG/5ML IJ SOLN
INTRAMUSCULAR | Status: DC | PRN
  Administered 2014-10-24: 2 mg via INTRAVENOUS

## 2014-10-24 MED ORDER — MIDAZOLAM HCL 2 MG/2ML IJ SOLN
INTRAMUSCULAR | Status: AC
  Filled 2014-10-24: qty 2

## 2014-10-24 MED ORDER — MIDAZOLAM HCL 2 MG/2ML IJ SOLN: 1.0000 mg | INTRAMUSCULAR | Status: DC | PRN

## 2014-10-24 MED ORDER — MEPERIDINE HCL 25 MG/ML IJ SOLN: 6.2500 mg | INTRAMUSCULAR | Status: DC | PRN

## 2014-10-24 MED ORDER — FENTANYL CITRATE 0.05 MG/ML IJ SOLN
INTRAMUSCULAR | Status: AC
  Filled 2014-10-24: qty 6

## 2014-10-24 MED ORDER — TAPENTADOL HCL 50 MG PO TABS: 100.0000 mg | ORAL_TABLET | ORAL | Status: DC | PRN

## 2014-10-24 MED ORDER — SODIUM CHLORIDE 0.9 % IJ SOLN
INTRAMUSCULAR | Status: AC
  Filled 2014-10-24: qty 10

## 2014-10-24 MED ORDER — PROMETHAZINE HCL 25 MG/ML IJ SOLN
INTRAMUSCULAR | Status: AC
  Filled 2014-10-24: qty 1

## 2014-10-24 MED ORDER — DIPHENHYDRAMINE HCL 50 MG/ML IJ SOLN: 10.0000 mg | Freq: Once | INTRAMUSCULAR | Status: DC

## 2014-10-24 MED ORDER — MIDAZOLAM HCL 2 MG/2ML IJ SOLN: 0.5000 mg | Freq: Once | INTRAMUSCULAR | Status: DC | PRN

## 2014-10-24 MED ORDER — FENTANYL CITRATE 0.05 MG/ML IJ SOLN
INTRAMUSCULAR | Status: DC | PRN
  Administered 2014-10-24: 100 ug via INTRAVENOUS

## 2014-10-24 MED ORDER — ONDANSETRON HCL 4 MG/2ML IJ SOLN
INTRAMUSCULAR | Status: DC | PRN
  Administered 2014-10-24: 4 mg via INTRAVENOUS

## 2014-10-24 MED ORDER — SCOPOLAMINE 1 MG/3DAYS TD PT72
1.0000 | MEDICATED_PATCH | Freq: Once | TRANSDERMAL | Status: DC
  Administered 2014-10-24: 1.5 mg via TRANSDERMAL

## 2014-10-24 MED ORDER — LACTATED RINGERS IV SOLN
INTRAVENOUS | Status: DC
  Administered 2014-10-24 (×2): via INTRAVENOUS

## 2014-10-24 MED ORDER — PROPOFOL 10 MG/ML IV BOLUS
INTRAVENOUS | Status: DC | PRN
  Administered 2014-10-24: 50 mg via INTRAVENOUS
  Administered 2014-10-24: 200 mg via INTRAVENOUS

## 2014-10-24 MED ORDER — BUPIVACAINE HCL (PF) 0.5 % IJ SOLN
INTRAMUSCULAR | Status: DC | PRN
Start: 2014-10-24 — End: 2014-10-24
  Administered 2014-10-24: 15 mL

## 2014-10-24 MED ORDER — SCOPOLAMINE 1 MG/3DAYS TD PT72
MEDICATED_PATCH | TRANSDERMAL | Status: AC
  Filled 2014-10-24: qty 1

## 2014-10-24 MED ORDER — PROMETHAZINE HCL 25 MG/ML IJ SOLN
6.2500 mg | INTRAMUSCULAR | Status: DC | PRN
  Administered 2014-10-24: 6.25 mg via INTRAVENOUS

## 2014-10-24 MED ORDER — DIPHENHYDRAMINE HCL 50 MG/ML IJ SOLN
INTRAMUSCULAR | Status: DC | PRN
  Administered 2014-10-24: 10 mg via INTRAVENOUS

## 2014-10-24 SURGICAL SUPPLY — 79 items
BANDAGE ELASTIC 3 VELCRO ST LF (GAUZE/BANDAGES/DRESSINGS) ×4 IMPLANT
BANDAGE ELASTIC 4 VELCRO ST LF (GAUZE/BANDAGES/DRESSINGS) ×4 IMPLANT
BANDAGE ELASTIC 6 VELCRO ST LF (GAUZE/BANDAGES/DRESSINGS) IMPLANT
BLADE CARPAL TUNNEL SNGL USE (BLADE) IMPLANT
BLADE CLIPPER SURG (BLADE) IMPLANT
BLADE SURG 15 STRL LF DISP TIS (BLADE) ×6 IMPLANT
BLADE SURG 15 STRL SS (BLADE) ×2
BNDG CONFORM 3 STRL LF (GAUZE/BANDAGES/DRESSINGS) ×4 IMPLANT
BNDG GAUZE ELAST 4 BULKY (GAUZE/BANDAGES/DRESSINGS) ×4 IMPLANT
BRUSH SCRUB EZ PLAIN DRY (MISCELLANEOUS) ×4 IMPLANT
CANISTER SUCT 1200ML W/VALVE (MISCELLANEOUS) IMPLANT
CORDS BIPOLAR (ELECTRODE) ×4 IMPLANT
COVER BACK TABLE 60X90IN (DRAPES) ×4 IMPLANT
COVER MAYO STAND STRL (DRAPES) ×4 IMPLANT
CUFF TOURN SGL LL 18 NRW (TOURNIQUET CUFF) ×4 IMPLANT
CUFF TOURNIQUET SINGLE 18IN (TOURNIQUET CUFF) IMPLANT
DECANTER SPIKE VIAL GLASS SM (MISCELLANEOUS) IMPLANT
DRAIN PENROSE 1/4X12 LTX STRL (WOUND CARE) IMPLANT
DRAPE EXTREMITY T 121X128X90 (DRAPE) ×4 IMPLANT
DRAPE INCISE IOBAN 66X45 STRL (DRAPES) IMPLANT
DRAPE SURG 17X23 STRL (DRAPES) ×4 IMPLANT
DRSG EMULSION OIL 3X3 NADH (GAUZE/BANDAGES/DRESSINGS) ×8 IMPLANT
EVACUATOR 1/8 PVC DRAIN (DRAIN) IMPLANT
EVACUATOR 3/16  PVC DRAIN (DRAIN)
EVACUATOR 3/16 PVC DRAIN (DRAIN) IMPLANT
EVACUATOR SILICONE 100CC (DRAIN) IMPLANT
GAUZE SPONGE 4X4 12PLY STRL (GAUZE/BANDAGES/DRESSINGS) ×4 IMPLANT
GAUZE SPONGE 4X4 16PLY XRAY LF (GAUZE/BANDAGES/DRESSINGS) IMPLANT
GAUZE XEROFORM 1X8 LF (GAUZE/BANDAGES/DRESSINGS) ×4 IMPLANT
GLOVE BIO SURGEON STRL SZ8 (GLOVE) ×4 IMPLANT
GLOVE BIOGEL M STRL SZ7.5 (GLOVE) IMPLANT
GLOVE BIOGEL PI IND STRL 7.0 (GLOVE) ×6 IMPLANT
GLOVE BIOGEL PI INDICATOR 7.0 (GLOVE) ×2
GLOVE ECLIPSE 6.5 STRL STRAW (GLOVE) ×4 IMPLANT
GLOVE SS BIOGEL STRL SZ 8 (GLOVE) ×3 IMPLANT
GLOVE SUPERSENSE BIOGEL SZ 8 (GLOVE) ×1
GOWN STRL REUS W/ TWL LRG LVL3 (GOWN DISPOSABLE) ×3 IMPLANT
GOWN STRL REUS W/ TWL XL LVL3 (GOWN DISPOSABLE) ×3 IMPLANT
GOWN STRL REUS W/TWL LRG LVL3 (GOWN DISPOSABLE) ×1
GOWN STRL REUS W/TWL XL LVL3 (GOWN DISPOSABLE) ×1
LOOP VESSEL MAXI BLUE (MISCELLANEOUS) IMPLANT
NDL SAFETY ECLIPSE 18X1.5 (NEEDLE) IMPLANT
NEEDLE HYPO 18GX1.5 SHARP (NEEDLE)
NEEDLE HYPO 22GX1.5 SAFETY (NEEDLE) IMPLANT
NEEDLE HYPO 25X1 1.5 SAFETY (NEEDLE) ×4 IMPLANT
NS IRRIG 1000ML POUR BTL (IV SOLUTION) ×4 IMPLANT
PACK BASIN DAY SURGERY FS (CUSTOM PROCEDURE TRAY) ×4 IMPLANT
PAD ALCOHOL SWAB (MISCELLANEOUS) IMPLANT
PAD CAST 3X4 CTTN HI CHSV (CAST SUPPLIES) ×6 IMPLANT
PADDING CAST ABS 4INX4YD NS (CAST SUPPLIES) ×1
PADDING CAST ABS COTTON 4X4 ST (CAST SUPPLIES) ×3 IMPLANT
PADDING CAST COTTON 3X4 STRL (CAST SUPPLIES) ×2
SHEET MEDIUM DRAPE 40X70 STRL (DRAPES) IMPLANT
SLEEVE SCD COMPRESS KNEE MED (MISCELLANEOUS) ×4 IMPLANT
SPLINT FAST PLASTER 5X30 (CAST SUPPLIES)
SPLINT PLASTER CAST FAST 5X30 (CAST SUPPLIES) IMPLANT
SPLINT PLASTER CAST XFAST 3X15 (CAST SUPPLIES) ×30 IMPLANT
SPLINT PLASTER XTRA FASTSET 3X (CAST SUPPLIES) ×10
SPONGE LAP 4X18 X RAY DECT (DISPOSABLE) IMPLANT
STOCKINETTE 4X48 STRL (DRAPES) ×4 IMPLANT
STOCKINETTE SYNTHETIC 3 UNSTER (CAST SUPPLIES) ×4 IMPLANT
STRIP CLOSURE SKIN 1/2X4 (GAUZE/BANDAGES/DRESSINGS) IMPLANT
SUCTION FRAZIER TIP 10 FR DISP (SUCTIONS) IMPLANT
SUT BONE WAX W31G (SUTURE) IMPLANT
SUT FIBERWIRE 2-0 18 17.9 3/8 (SUTURE)
SUT PROLENE 4 0 PS 2 18 (SUTURE) ×8 IMPLANT
SUT VIC AB 3-0 FS2 27 (SUTURE) IMPLANT
SUT VIC AB 4-0 P-3 18XBRD (SUTURE) IMPLANT
SUT VIC AB 4-0 P3 18 (SUTURE)
SUTURE FIBERWR 2-0 18 17.9 3/8 (SUTURE) IMPLANT
SYR BULB 3OZ (MISCELLANEOUS) ×4 IMPLANT
SYR CONTROL 10ML LL (SYRINGE) IMPLANT
TAPE SURG TRANSPORE 1 IN (GAUZE/BANDAGES/DRESSINGS) IMPLANT
TAPE SURGICAL TRANSPORE 1 IN (GAUZE/BANDAGES/DRESSINGS)
TOWEL OR 17X24 6PK STRL BLUE (TOWEL DISPOSABLE) ×8 IMPLANT
TOWEL OR NON WOVEN STRL DISP B (DISPOSABLE) ×4 IMPLANT
TRAY DSU PREP LF (CUSTOM PROCEDURE TRAY) ×4 IMPLANT
TUBE CONNECTING 20X1/4 (TUBING) IMPLANT
UNDERPAD 30X30 INCONTINENT (UNDERPADS AND DIAPERS) ×4 IMPLANT

## 2014-10-24 NOTE — Anesthesia Preprocedure Evaluation (Addendum)
Anesthesia Evaluation  Patient identified by MRN, date of birth, ID band Patient awake    History of Anesthesia Complications (+) PONV and history of anesthetic complications  Airway Mallampati: II  TM Distance: >3 FB Neck ROM: Full    Dental  (+) Teeth Intact, Dental Advisory Given   Pulmonary neg pulmonary ROS,  breath sounds clear to auscultation        Cardiovascular hypertension, Pt. on medications - anginaRhythm:Regular Rate:Normal     Neuro/Psych    GI/Hepatic Neg liver ROS, GERD-  Medicated and Controlled,  Endo/Other  Morbid obesity  Renal/GU negative Renal ROS     Musculoskeletal  (+) Arthritis -, Osteoarthritis,    Abdominal (+) + obese,   Peds  Hematology   Anesthesia Other Findings   Reproductive/Obstetrics                            Anesthesia Physical Anesthesia Plan  ASA: II  Anesthesia Plan: General   Post-op Pain Management:    Induction: Intravenous  Airway Management Planned: LMA  Additional Equipment:   Intra-op Plan:   Post-operative Plan:   Informed Consent: I have reviewed the patients History and Physical, chart, labs and discussed the procedure including the risks, benefits and alternatives for the proposed anesthesia with the patient or authorized representative who has indicated his/her understanding and acceptance.   Dental advisory given  Plan Discussed with: CRNA and Surgeon  Anesthesia Plan Comments: (Plan routine monitors, GA- LMA OK)        Anesthesia Quick Evaluation

## 2014-10-24 NOTE — Discharge Instructions (Signed)
We recommend that you to take vitamin C 1000 mg a day to promote healing we also recommend that if you require  pain medicine that you take a stool softener to prevent constipation as most pain medicines will have constipation side effects. We recommend either Peri-Colace or Senokot and recommend that you also consider adding MiraLAX to prevent the constipation affects from pain medicine if you are required to use them. These medicines are over the counter and maybe purchased at a local pharmacy.  We also rec Vit B6 200 mg a day for nerve health   Keep bandage clean and dry.  Call for any problems.  No smoking.  Criteria for driving a car: you should be off your pain medicine for 7-8 hours, able to drive one handed(confident), thinking clearly and feeling able in your judgement to drive. Continue elevation as it will decrease swelling.  If instructed by MD move your fingers within the confines of the bandage/splint.  Use ice if instructed by your MD. Call immediately for any sudden loss of feeling in your hand/arm or change in functional abilities of the extremity.   Post Anesthesia Home Care Instructions  Activity: Get plenty of rest for the remainder of the day. A responsible adult should stay with you for 24 hours following the procedure.  For the next 24 hours, DO NOT: -Drive a car -Paediatric nurse -Drink alcoholic beverages -Take any medication unless instructed by your physician -Make any legal decisions or sign important papers.  Meals: Start with liquid foods such as gelatin or soup. Progress to regular foods as tolerated. Avoid greasy, spicy, heavy foods. If nausea and/or vomiting occur, drink only clear liquids until the nausea and/or vomiting subsides. Call your physician if vomiting continues.  Special Instructions/Symptoms: Your throat may feel dry or sore from the anesthesia or the breathing tube placed in your throat during surgery. If this causes discomfort, gargle with warm  salt water. The discomfort should disappear within 24 hours.

## 2014-10-24 NOTE — H&P (Signed)
Judy Jones is an 49 y.o. female.   Chief Complaint: L CTS & CUTS HPI: Patient presents for evaluation and treatment of the of their upper extremity predicament. The patient denies neck back chest or of abdominal pain. The patient notes that they have no lower extremity problems. The patient from primarily complains of the upper extremity pain noted.  Past Medical History  Diagnosis Date  . Anxiety   . GERD (gastroesophageal reflux disease)   . Arthritis   . Wears glasses   . Hypertension   . Complication of anesthesia   . PONV (postoperative nausea and vomiting)   . Family history of anesthesia complication     Mother get sick on her stomach    Past Surgical History  Procedure Laterality Date  . Wisdom tooth extraction    . Cholecystectomy  1991  . Cesarean section  1985  . Eye surgery  2013    both cataracts  . Vaginal hysterectomy  2007    ant/post repair  . Colonoscopy      Hx: of  . Ulnar nerve transposition Right 08/01/2013    Procedure: RIGHT LIMITED CARPAL TUNNEL RELEASE,RIGHT CUBITAL TUNNEL RELEASE; right cubital tunnel release INSITU ,EXCISION RIGHT MIDDLE FINGER MASS;  Surgeon: Roseanne Kaufman, MD;  Location: [place];  Service: Orthopedics;  Laterality: Right;    Family History  Problem Relation Age of Onset  . Hypertension Mother   . Bipolar disorder Other    Social History:  reports that she has never smoked. She has never used smokeless tobacco. She reports that she drinks about 0.5 oz of alcohol per week. She reports that she does not use illicit drugs.  Allergies:  Allergies  Allergen Reactions  . Shellfish Allergy Anaphylaxis  . Morphine Hives  . Penicillins Other (See Comments)    Childhood reaction: possibly hives   . Percocet [Oxycodone-Acetaminophen] Nausea And Vomiting    Medications Prior to Admission  Medication Sig Dispense Refill  . ALPRAZolam (XANAX) 0.25 MG tablet Take 0.25 mg by mouth daily as needed for anxiety.     . Cholecalciferol  (VITAMIN D) 2000 UNITS CAPS Take 2,000 Units by mouth daily.    Marland Kitchen esomeprazole (NEXIUM) 40 MG capsule Take 40 mg by mouth every evening.    . loratadine (CLARITIN) 10 MG tablet Take 10 mg by mouth daily.    Marland Kitchen losartan (COZAAR) 100 MG tablet Take 100 mg by mouth daily.    Marland Kitchen nystatin-triamcinolone ointment (MYCOLOG) Apply 1 application topically 2 (two) times daily. Apply BID for up to 7 days. 60 g 0    Results for orders placed or performed during the hospital encounter of 10/24/14 (from the past 48 hour(s))  Basic metabolic panel     Status: Abnormal   Collection Time: 10/22/14  1:00 PM  Result Value Ref Range   Sodium 136 135 - 145 mmol/L   Potassium 4.3 3.5 - 5.1 mmol/L   Chloride 101 96 - 112 mmol/L   CO2 29 19 - 32 mmol/L   Glucose, Bld 88 70 - 99 mg/dL   BUN 17 6 - 23 mg/dL   Creatinine, Ser 0.85 0.50 - 1.10 mg/dL   Calcium 9.7 8.4 - 10.5 mg/dL   GFR calc non Af Amer 79 (L) >90 mL/min   GFR calc Af Amer >90 >90 mL/min    Comment: (NOTE) The eGFR has been calculated using the CKD EPI equation. This calculation has not been validated in all clinical situations. eGFR's persistently <90 mL/min  signify possible Chronic Kidney Disease.    Anion gap 6 5 - 15  Hemoglobin-hemacue, POC     Status: Abnormal   Collection Time: 10/24/14  8:51 AM  Result Value Ref Range   Hemoglobin 15.4 (H) 12.0 - 15.0 g/dL   No results found.  ROS  Blood pressure 136/79, pulse 100, temperature 98.2 F (36.8 C), temperature source Oral, resp. rate 18, height $RemoveBe'5\' 5"'SyNVavKuD$  (1.651 m), weight 84.539 kg (186 lb 6 oz), last menstrual period 01/13/2006, SpO2 98 %. Physical Exam The patient is alert and oriented in no acute distress the patient complains of pain in the affected upper extremity.  The patient is noted to have a normal HEENT exam.  Lung fields show equal chest expansion and no shortness of breath  abdomen exam is nontender without distention.  Lower extremity examination does not show any fracture  dislocation or blood clot symptoms.  Pelvis is stable neck and back are stable and nontender  Assessment/Plan We are planning surgery for your upper extremity. The risk and benefits of surgery include risk of bleeding infection anesthesia damage to normal structures and failure of the surgery to accomplish its intended goals of relieving symptoms and restoring function with this in mind we'll going to proceed. I have specifically discussed with the patient the pre-and postoperative regime and the does and don'ts and risk and benefits in great detail. Risk and benefits of surgery also include risk of dystrophy chronic nerve pain failure of the healing process to go onto completion and other inherent risks of surgery The relavent the pathophysiology of the disease/injury process, as well as the alternatives for treatment and postoperative course of action has been discussed in great detail with the patient who desires to proceed.  We will do everything in our power to help you (the patient) restore function to the upper extremity. Is a pleasure to see this patient today.   Plan L CTR and [facility]  Mckena Chern III,Ayson Cherubini M 10/24/2014, 9:11 AM

## 2014-10-24 NOTE — Anesthesia Postprocedure Evaluation (Signed)
  Anesthesia Post-op Note  Patient: Judy Jones  Procedure(s) Performed: Procedure(s): LEFT CARPAL TUNNEL  (Left) ULNAR NERVE DECOMPRESSION AT ELBOW (Left)  Patient Location: PACU  Anesthesia Type:General  Level of Consciousness: awake  Airway and Oxygen Therapy: Patient Spontanous Breathing  Post-op Pain: mild  Post-op Assessment: Post-op Vital signs reviewed  Post-op Vital Signs: Reviewed  Last Vitals:  Filed Vitals:   10/24/14 1130  BP: 134/67  Pulse: 89  Temp:   Resp: 13    Complications: No apparent anesthesia complications

## 2014-10-24 NOTE — Transfer of Care (Signed)
Immediate Anesthesia Transfer of Care Note  Patient: Judy Jones Born  Procedure(s) Performed: Procedure(s): LEFT CARPAL TUNNEL  (Left) ULNAR NERVE DECOMPRESSION AT ELBOW (Left)  Patient Location: PACU  Anesthesia Type:General  Level of Consciousness: awake, sedated and patient cooperative  Airway & Oxygen Therapy: Patient Spontanous Breathing and Patient connected to face mask oxygen  Post-op Assessment: Report given to RN and Post -op Vital signs reviewed and stable  Post vital signs: Reviewed and stable  Last Vitals:  Filed Vitals:   10/24/14 0818  BP: 136/79  Pulse: 100  Temp: 36.8 C  Resp: 18    Complications: No apparent anesthesia complications

## 2014-10-24 NOTE — Op Note (Signed)
See TELM#[id number] Amedeo Plenty MD

## 2014-10-27 ENCOUNTER — Encounter (HOSPITAL_BASED_OUTPATIENT_CLINIC_OR_DEPARTMENT_OTHER): Payer: Self-pay | Admitting: Orthopedic Surgery

## 2014-10-27 NOTE — Op Note (Signed)
NAMEETNA, FORQUER NO.:  [id number]  MEDICAL RECORD NO.:  [id number]  LOCATION:                                 FACILITY:  PHYSICIAN:  Satira Anis. Joy Reiger, M.D.DATE OF BIRTH:  [birth year]  DATE OF PROCEDURE:  10/24/2014 DATE OF DISCHARGE:  10/24/2014                              OPERATIVE REPORT   PREOPERATIVE DIAGNOSES: 1. Left carpal tunnel syndrome. 2. Left ulnar nerve compression at the elbow.  POSTOPERATIVE DIAGNOSES: 1. Left carpal tunnel syndrome. 2. Left ulnar nerve compression at the elbow.  PROCEDURE: 1. Left limited open carpal tunnel release. 2. Left ulnar nerve release at the elbow (cubital tunnel release).     This was an in situ release.  SURGEON:  Satira Anis. Amedeo Plenty, M.D.  ASSISTANT:  None.  COMPLICATIONS:  None.  ANESTHESIA:  General.  TOURNIQUET TIME:  Less than an hour.  INDICATIONS FOR THE PROCEDURE:  A 49 year old female has had prior procedures similarly performed on the right side.  She understands risks and benefits of surgery including risk of infection, bleeding, anesthesia, damage to normal structures, and failure of surgery to accomplish its intended goals of relieving symptoms and restoring function.  With this in mind, she desires to proceed.  I have discussed all issues with her at length.  The patient currently has failed conservative algorithm of care for the left upper extremity and desires to proceed with the nerve release.  On examination, she has positive Tinel's and Phalen's test at the wrist as well as Tinel's and elbow flexion test at the elbow.  OPERATION IN DETAIL:  The patient was seen by myself and Anesthesia, taken to the operative suite, underwent general anesthetic. Preoperative vancomycin was given.  She was prepped and draped in usual sterile fashion.  Betadine scrub and paint about the left upper extremity.  Tourniquet was insufflated.  Time-out called.  Body parts well padded and checked.   Following this, 1 cm incision was made at the distal edge of the transcarpal ligament.  Dissection was carried down. The patient had the distal edge of the transverse carpal ligament identified, released under 4.0 loupe magnification.  Fat pad egressed nicely.  Following this, distal and proximal dissection were carried out intact room was available for release of the proximal leaflet and portions of the antebrachial fascia under direct vision utilizing 4.0 loupe magnification.  I used an iridectomy scissor to slide and release the proximal leaflet.  The median nerve was protected and kept out of harm's way.  It was hyperemic intact and without complications.  She had a nice ligamentous release to decompress the canal.  We irrigated and closed the wound with Prolene after hemostasis was secured and 8 mL of Sensorcaine were placed for postop analgesia.  Following this, a curvilinear posterior medial incision was made about the elbow.  Dissection was carried down and the patient underwent a very careful and cautious approach to the elbow with release of the arcade of Struthers, followed by release of the medial intermuscular septum, cubital tunnel, Osborne's ligament, and the 2 heads of the FCU, both superficial and deep.  The maximum site of compression was over Osborne's ligament, which was not  unusual in this scenario.  Following full release, I then demonstrated full active range of motion.  She had excellent nerve gliding.  No complicating features.  No adhesions and no tendency towards subluxation and anterior applying.  Following this, we irrigated copiously, deflated the tourniquet, closed the wound with Prolene after hemostasis was secured and placed 10 mL Sensorcaine without epinephrine in the wound for postop analgesia.  Soft dressing was applied.  We will place her on Nucynta p.r.n. pain.  See her back in the office in a week.  Therapy in 12 days for suture removal.   Notify me should problems occur.  We simply begin nerve glides ASAP and move forward accordingly.     Satira Anis. Amedeo Plenty, M.D.     [facility]  D:  10/24/2014  T:  10/25/2014  Job:  [id number]

## 2015-05-01 ENCOUNTER — Ambulatory Visit: Payer: 59 | Admitting: Obstetrics and Gynecology

## 2015-05-06 ENCOUNTER — Ambulatory Visit (INDEPENDENT_AMBULATORY_CARE_PROVIDER_SITE_OTHER): Payer: Commercial Managed Care - HMO | Admitting: Obstetrics and Gynecology

## 2015-05-06 ENCOUNTER — Encounter: Payer: Self-pay | Admitting: Obstetrics and Gynecology

## 2015-05-06 VITALS — BP 128/78 | HR 92 | Resp 14 | Ht 64.5 in | Wt 188.0 lb

## 2015-05-06 DIAGNOSIS — L280 Lichen simplex chronicus: Secondary | ICD-10-CM | POA: Diagnosis not present

## 2015-05-06 DIAGNOSIS — R319 Hematuria, unspecified: Secondary | ICD-10-CM | POA: Diagnosis not present

## 2015-05-06 DIAGNOSIS — Z0000 Encounter for general adult medical examination without abnormal findings: Secondary | ICD-10-CM | POA: Diagnosis not present

## 2015-05-06 DIAGNOSIS — Z01419 Encounter for gynecological examination (general) (routine) without abnormal findings: Secondary | ICD-10-CM | POA: Diagnosis not present

## 2015-05-06 DIAGNOSIS — K589 Irritable bowel syndrome without diarrhea: Secondary | ICD-10-CM | POA: Diagnosis not present

## 2015-05-06 LAB — POCT URINALYSIS DIPSTICK
Bilirubin, UA: NEGATIVE
Glucose, UA: NEGATIVE
KETONES UA: NEGATIVE
LEUKOCYTES UA: NEGATIVE
Nitrite, UA: NEGATIVE
PROTEIN UA: NEGATIVE
Urobilinogen, UA: NEGATIVE
pH, UA: 7

## 2015-05-06 MED ORDER — BETAMETHASONE VALERATE 0.1 % EX OINT: TOPICAL_OINTMENT | CUTANEOUS | Status: DC

## 2015-05-06 NOTE — Patient Instructions (Signed)
EXERCISE AND DIET:  We recommended that you start or continue a regular exercise program for good health. Regular exercise means any activity that makes your heart beat faster and makes you sweat.  We recommend exercising at least 30 minutes per day at least 3 days a week, preferably 4 or 5.  We also recommend a diet low in fat and sugar.  Inactivity, poor dietary choices and obesity can cause diabetes, heart attack, stroke, and kidney damage, among others.    ALCOHOL AND SMOKING:  Women should limit their alcohol intake to no more than 7 drinks/beers/glasses of wine (combined, not each!) per week. Moderation of alcohol intake to this level decreases your risk of breast cancer and liver damage. And of course, no recreational drugs are part of a healthy lifestyle.  And absolutely no smoking or even second hand smoke. Most people know smoking can cause heart and lung diseases, but did you know it also contributes to weakening of your bones? Aging of your skin?  Yellowing of your teeth and nails?  CALCIUM AND VITAMIN D:  Adequate intake of calcium and Vitamin D are recommended.  The recommendations for exact amounts of these supplements seem to change often, but generally speaking 600 mg of calcium (either carbonate or citrate) and 800 units of Vitamin D per day seems prudent. Certain women may benefit from higher intake of Vitamin D.  If you are among these women, your doctor will have told you during your visit.    PAP SMEARS:  Pap smears, to check for cervical cancer or precancers,  have traditionally been done yearly, although recent scientific advances have shown that most women can have pap smears less often.  However, every woman still should have a physical exam from her gynecologist every year. It will include a breast check, inspection of the vulva and vagina to check for abnormal growths or skin changes, a visual exam of the cervix, and then an exam to evaluate the size and shape of the uterus and  ovaries.  And after 51 years of age, a rectal exam is indicated to check for rectal cancers. We will also provide age appropriate advice regarding health maintenance, like when you should have certain vaccines, screening for sexually transmitted diseases, bone density testing, colonoscopy, mammograms, etc.   MAMMOGRAMS:  All women over 51 years old should have a yearly mammogram. Many facilities now offer a "3D" mammogram, which may cost around $50 extra out of pocket. If possible,  we recommend you accept the option to have the 3D mammogram performed.  It both reduces the number of women who will be called back for extra views which then turn out to be normal, and it is better than the routine mammogram at detecting truly abnormal areas.    COLONOSCOPY:  Colonoscopy to screen for colon cancer is recommended for all women at age 51.  We know, you hate the idea of the prep.  We agree, BUT, having colon cancer and not knowing it is worse!!  Colon cancer so often starts as a polyp that can be seen and removed at colonscopy, which can quite literally save your life!  And if your first colonoscopy is normal and you have no family history of colon cancer, most women don't have to have it again for 10 years.  Once every ten years, you can do something that may end up saving your life, right?  We will be happy to help you get it scheduled when you are ready.    Be sure to check your insurance coverage so you understand how much it will cost.  It may be covered as a preventative service at no cost, but you should check your particular policy.     Kegel Exercises The goal of Kegel exercises is to isolate and exercise your pelvic floor muscles. These muscles act as a hammock that supports the rectum, vagina, small intestine, and uterus. As the muscles weaken, the hammock sags and these organs are displaced from their normal positions. Kegel exercises can strengthen your pelvic floor muscles and help you to improve  bladder and bowel control, improve sexual response, and help reduce many problems and some discomfort during pregnancy. Kegel exercises can be done anywhere and at any time. HOW TO PERFORM KEGEL EXERCISES 1. Locate your pelvic floor muscles. To do this, squeeze (contract) the muscles that you use when you try to stop the flow of urine. You will feel a tightness in the vaginal area (women) and a tight lift in the rectal area (men and women). 2. When you begin, contract your pelvic muscles tight for 2-5 seconds, then relax them for 2-5 seconds. This is one set. Do 4-5 sets with a short pause in between. 3. Contract your pelvic muscles for 8-10 seconds, then relax them for 8-10 seconds. Do 4-5 sets. If you cannot contract your pelvic muscles for 8-10 seconds, try 5-7 seconds and work your way up to 8-10 seconds. Your goal is 4-5 sets of 10 contractions each day. Keep your stomach, buttocks, and legs relaxed during the exercises. Perform sets of both short and long contractions. Vary your positions. Perform these contractions 3-4 times per day. Perform sets while you are:   Lying in bed in the morning.  Standing at lunch.  Sitting in the late afternoon.  Lying in bed at night. You should do 40-50 contractions per day. Do not perform more Kegel exercises per day than recommended. Overexercising can cause muscle fatigue. Continue these exercises for for at least 15-20 weeks or as directed by your caregiver. Document Released: 07/18/2012 Document Reviewed: 07/18/2012 [employer] Patient Information 2015 [employer]. This information is not intended to replace advice given to you by your health care provider. Make sure you discuss any questions you have with your health care provider.  

## 2015-05-06 NOTE — Progress Notes (Signed)
Patient ID: Judy Jones, female   DOB: 1966/01/28, 49 y.o.   MRN: [id number] 49 y.o. [id number] MarriedCaucasianF here for annual exam.  H/O TVH, A&P repair in 2007.  Patient is c/o urinary incontinence when she coughs. She was treated for a respiratory virus in mid August, can't get rid of the cough. She is leaking every times she coughs, can be small to large amounts. Better during the day at work. Typically she would leak small amounts with valsalva and a full bladder. Mostly tolerable. She seems to have these issues in the fall, has allergies.  She c/o intermittent perineal/perianal irritation, worse when leaking more urine, currently irritated and itchy. No abnormal d/c or vulvar c/o.  No vaginal bleeding. No dyspareunia.   Patient's last menstrual period was 01/13/2006.          Sexually active: Yes.    The current method of family planning is hysterectomy.    Exercising: No.  The patient does not participate in regular exercise at present. Smoker:  no  Health Maintenance: Pap:  06-28-11 WNL History of abnormal Pap:  no MMG:  01-28-14 WNL Colonoscopy:  Has one scheduled 05/2015 BMD:   Never TDaP:  2014  Gardasil: N/A   reports that she has never smoked. She has never used smokeless tobacco. She reports that she drinks about 0.5 oz of alcohol per week. She reports that she does not use illicit drugs.  Past Medical History  Diagnosis Date  . Anxiety   . GERD (gastroesophageal reflux disease)   . Arthritis   . Wears glasses   . Hypertension   . Complication of anesthesia   . PONV (postoperative nausea and vomiting)   . Family history of anesthesia complication     Mother get sick on her stomach    Past Surgical History  Procedure Laterality Date  . Wisdom tooth extraction    . Cholecystectomy  1991  . Cesarean section  1985  . Eye surgery  2013    both cataracts  . Vaginal hysterectomy  2007    ant/post repair  . Colonoscopy      Hx: of  . Ulnar nerve transposition  Right 08/01/2013    Procedure: RIGHT LIMITED CARPAL TUNNEL RELEASE,RIGHT CUBITAL TUNNEL RELEASE; right cubital tunnel release INSITU ,EXCISION RIGHT MIDDLE FINGER MASS;  Surgeon: Roseanne Kaufman, MD;  Location: [place];  Service: Orthopedics;  Laterality: Right;  . Carpal tunnel release Left 10/24/2014    Procedure: LEFT CARPAL TUNNEL ;  Surgeon: Roseanne Kaufman, MD;  Location: [place];  Service: Orthopedics;  Laterality: Left;  . Ulnar nerve transposition Left 10/24/2014    Procedure: ULNAR NERVE DECOMPRESSION AT ELBOW;  Surgeon: Roseanne Kaufman, MD;  Location: [place];  Service: Orthopedics;  Laterality: Left;    Current Outpatient Prescriptions  Medication Sig Dispense Refill  . ALPRAZolam (XANAX) 0.25 MG tablet Take 0.25 mg by mouth daily as needed for anxiety.     . Cholecalciferol (VITAMIN D) 2000 UNITS CAPS Take 2,000 Units by mouth daily.    Marland Kitchen esomeprazole (NEXIUM) 40 MG capsule Take 40 mg by mouth every evening.    . loratadine (CLARITIN) 10 MG tablet Take 10 mg by mouth daily.    Marland Kitchen losartan (COZAAR) 100 MG tablet Take 100 mg by mouth daily.    Marland Kitchen nystatin-triamcinolone ointment (MYCOLOG) Apply 1 application topically 2 (two) times daily. Apply BID for up to 7 days. 60 g 0  . betamethasone valerate ointment (VALISONE) 0.1 %  Apply a pea sized amount to skin 2 x a day for 1-2 weeks as needed 30 g 0  . chlorpheniramine-HYDROcodone (TUSSIONEX) 10-8 MG/5ML SUER TAKE 1 TEASPOONFUL BY MOUTH EVERY 12 HOURS AS NEEDED  0  . losartan-hydrochlorothiazide (HYZAAR) 100-12.5 MG per tablet      No current facility-administered medications for this visit.    Family History  Problem Relation Age of Onset  . Hypertension Mother   . Bipolar disorder Other     Review of Systems  Constitutional: Negative.   HENT: Negative.   Eyes: Negative.   Respiratory: Negative.   Cardiovascular: Negative.   Gastrointestinal: Negative.   Endocrine: Negative.   Genitourinary:  Positive for frequency.       Urinary incontinence with cough   Musculoskeletal: Negative.   Skin: Negative.   Allergic/Immunologic: Negative.   Neurological: Negative.   Psychiatric/Behavioral: Negative.     Exam:   BP 128/78 mmHg  Pulse 92  Resp 14  Ht 5' 4.5" (1.638 m)  Wt 188 lb (85.276 kg)  BMI 31.78 kg/m2  LMP 01/13/2006  Weight change: @WEIGHTCHANGE @ Height:   Height: 5' 4.5" (163.8 cm)  Ht Readings from Last 3 Encounters:  05/06/15 5' 4.5" (1.638 m)  10/24/14 5\' 5"  (1.651 m)  04/23/14 5\' 5"  (1.651 m)    General appearance: alert, cooperative and appears stated age Head: Normocephalic, without obvious abnormality, atraumatic Neck: no adenopathy, supple, symmetrical, trachea midline and thyroid normal to inspection and palpation Lungs: clear to auscultation bilaterally Breasts: normal appearance, no masses or tenderness Heart: regular rate and rhythm Abdomen: soft, non-tender; bowel sounds normal; no masses,  no organomegaly Extremities: extremities normal, atraumatic, no cyanosis or edema Skin: Skin color, texture, turgor normal. No rashes or lesions Lymph nodes: Cervical, supraclavicular, and axillary nodes normal. No abnormal inguinal nodes palpated Neurologic: Grossly normal   Pelvic: External genitalia:  no lesions, slight perineal erythema.               Urethra:  normal appearing urethra with no masses, tenderness or lesions              Bartholins and Skenes: normal                 Vagina: normal appearing vagina with normal color and discharge, no lesions              Cervix: absent               Bimanual Exam:  Uterus:  uterus absent              Adnexa: no mass, fullness, tenderness               Rectovaginal: Confirms               Anus:  normal sphincter tone, perianal erythema, some whitening, slight fissure  Chaperone was present for exam.  Urine dip +2 blood, otherwise negative  A:  Well Woman with normal exam  GSI, worse with  URI  Hematuria  P:   No paps needed  Mammogram due, she will schedule  Colonoscopy next month  Labs with primary   Discussed calcium intake, continue vit D  Discussed Breast self exam  Discussed kegels, physical therapy and surgery  Send urine for ua, c&s

## 2015-05-07 LAB — URINALYSIS, MICROSCOPIC ONLY
BACTERIA UA: NONE SEEN [HPF]
Casts: NONE SEEN [LPF]
Crystals: NONE SEEN [HPF]
RBC / HPF: NONE SEEN RBC/HPF (ref <=2)
WBC UA: NONE SEEN WBC/HPF (ref <=5)
Yeast: NONE SEEN [HPF]

## 2015-05-08 LAB — URINE CULTURE
COLONY COUNT: NO GROWTH
ORGANISM ID, BACTERIA: NO GROWTH

## 2015-09-11 ENCOUNTER — Other Ambulatory Visit: Payer: Self-pay

## 2015-09-11 DIAGNOSIS — Z1231 Encounter for screening mammogram for malignant neoplasm of breast: Secondary | ICD-10-CM

## 2015-09-28 ENCOUNTER — Ambulatory Visit: Payer: 59

## 2015-10-05 ENCOUNTER — Ambulatory Visit
Admission: RE | Admit: 2015-10-05 | Discharge: 2015-10-05 | Disposition: A | Discharge status: Home / Self Care | Payer: 59 | Source: Ambulatory Visit

## 2015-10-05 DIAGNOSIS — Z1231 Encounter for screening mammogram for malignant neoplasm of breast: Secondary | ICD-10-CM

## 2016-05-13 ENCOUNTER — Encounter: Payer: Self-pay | Admitting: Obstetrics and Gynecology

## 2016-05-13 ENCOUNTER — Ambulatory Visit (INDEPENDENT_AMBULATORY_CARE_PROVIDER_SITE_OTHER): Payer: Managed Care, Other (non HMO) | Admitting: Obstetrics and Gynecology

## 2016-05-13 VITALS — BP 130/72 | HR 76 | Resp 16 | Ht 65.0 in | Wt 195.4 lb

## 2016-05-13 DIAGNOSIS — Z01419 Encounter for gynecological examination (general) (routine) without abnormal findings: Secondary | ICD-10-CM

## 2016-05-13 DIAGNOSIS — R5383 Other fatigue: Secondary | ICD-10-CM

## 2016-05-13 DIAGNOSIS — Z113 Encounter for screening for infections with a predominantly sexual mode of transmission: Secondary | ICD-10-CM | POA: Diagnosis not present

## 2016-05-13 LAB — POCT URINALYSIS DIPSTICK
BILIRUBIN UA: NEGATIVE
Glucose, UA: NEGATIVE
KETONES UA: NEGATIVE
Leukocytes, UA: NEGATIVE
Nitrite, UA: NEGATIVE
PH UA: 5
Protein, UA: NEGATIVE
RBC UA: NEGATIVE
Urobilinogen, UA: NEGATIVE

## 2016-05-13 LAB — TSH: TSH: 2.1 mIU/L

## 2016-05-13 MED ORDER — BETAMETHASONE VALERATE 0.1 % EX OINT: TOPICAL_OINTMENT | CUTANEOUS | 0 refills | Status: DC

## 2016-05-13 NOTE — Patient Instructions (Signed)

## 2016-05-13 NOTE — Progress Notes (Signed)
50 y.o. G24P2002 Married Caucasian female here for annual exam.    Tired all the time.  Did have general blood work at PCP.  Dx with prediabetes.  Cholesterol was Ok.  Husband states she is snoring.   No hot flashes or night sweats.   Has stress incontinence with sneeze or cough.  It is worse with allergies in the fall.  Wears a pad daily.  Has perianal irritation and wants more Valisone.  Has rectal fissures.   Wants to loose weight.  Did Weight Watchers in the past.  Hurts to walk.   Lots of stress.  Husband unemployed and merger at work.   PCP:  Antony Contras, MD   Patient's last menstrual period was 01/13/2006.           Sexually active: Yes.   female The current method of family planning is status post hysterectomy--ovaries remain.    Exercising: No.   Smoker:  no  Health Maintenance: Pap:  2012 normal History of abnormal Pap:  no MMG:  10-05-15 3D/Density B/Neg/[facility]Rads[facility] Colonoscopy:  Did several years ago prior to her hysterectomy. This was done to rule out Crohn's and was negative for this. Was scheduled with Dr. Earlean Shawl last year but did not keep appt.  BMD:   n/a  Result  n/a TDaP:  2014 Gardasil:   N/A HIV:  In pregnancy.  Hep C:  Today.  Screening Labs:  Hb today: PCP, Urine today: Neg   reports that she has never smoked. She has never used smokeless tobacco. She reports that she drinks about 0.5 oz of alcohol per week . She reports that she does not use drugs.  Past Medical History:  Diagnosis Date  . Anxiety   . Arthritis   . Complication of anesthesia   . Family history of anesthesia complication    Mother get sick on her stomach  . GERD (gastroesophageal reflux disease)   . Hypertension   . IBS (irritable bowel syndrome)   . PONV (postoperative nausea and vomiting)   . Wears glasses     Past Surgical History:  Procedure Laterality Date  . CARPAL TUNNEL RELEASE Left 10/24/2014   Procedure: LEFT CARPAL TUNNEL ;  Surgeon: Roseanne Kaufman, MD;  Location: [place];  Service: Orthopedics;  Laterality: Left;  . CESAREAN SECTION  1985  . CHOLECYSTECTOMY  1991  . COLONOSCOPY     Hx: of  . EYE SURGERY  2013   both cataracts  . ULNAR NERVE TRANSPOSITION Right 08/01/2013   Procedure: RIGHT LIMITED CARPAL TUNNEL RELEASE,RIGHT CUBITAL TUNNEL RELEASE; right cubital tunnel release INSITU ,EXCISION RIGHT MIDDLE FINGER MASS;  Surgeon: Roseanne Kaufman, MD;  Location: [place];  Service: Orthopedics;  Laterality: Right;  . ULNAR NERVE TRANSPOSITION Left 10/24/2014   Procedure: ULNAR NERVE DECOMPRESSION AT ELBOW;  Surgeon: Roseanne Kaufman, MD;  Location: [place];  Service: Orthopedics;  Laterality: Left;  Marland Kitchen VAGINAL HYSTERECTOMY  2007   ant/post repair  . WISDOM TOOTH EXTRACTION      Current Outpatient Prescriptions  Medication Sig Dispense Refill  . ALPRAZolam (XANAX) 0.25 MG tablet Take 0.25 mg by mouth daily as needed for anxiety.     . betamethasone valerate ointment (VALISONE) 0.1 % Apply a pea sized amount to skin 2 x a day for 1-2 weeks as needed 30 g 0  . Cholecalciferol (VITAMIN D) 2000 UNITS CAPS Take 2,000 Units by mouth daily.    Marland Kitchen esomeprazole (NEXIUM) 40 MG capsule  Take 40 mg by mouth every evening.    . loratadine (CLARITIN) 10 MG tablet Take 10 mg by mouth daily.    Marland Kitchen losartan (COZAAR) 100 MG tablet Take 100 mg by mouth daily.    Marland Kitchen losartan-hydrochlorothiazide (HYZAAR) 100-12.5 MG per tablet     . nystatin-triamcinolone ointment (MYCOLOG) Apply 1 application topically 2 (two) times daily. Apply BID for up to 7 days. 60 g 0   No current facility-administered medications for this visit.     Family History  Problem Relation Age of Onset  . Hypertension Mother   . Atrial fibrillation Mother   . Bipolar disorder Other     ROS:  Pertinent items are noted in HPI.  Otherwise, a comprehensive ROS was negative.  Exam:   BP 130/72 (BP Location: Right Arm, Patient Position: Sitting, Cuff  Size: Large)   Pulse 76   Resp 16   Ht 5\' 5"  (1.651 m)   Wt 195 lb 6.4 oz (88.6 kg)   LMP 01/13/2006   BMI 32.52 kg/m     General appearance: alert, cooperative and appears stated age Head: Normocephalic, without obvious abnormality, atraumatic Neck: no adenopathy, supple, symmetrical, trachea midline and thyroid normal to inspection and palpation Lungs: clear to auscultation bilaterally Breasts: normal appearance, no masses or tenderness, No nipple retraction or dimpling, No nipple discharge or bleeding, No axillary or supraclavicular adenopathy Heart: regular rate and rhythm Abdomen: soft, non-tender; no masses, no organomegaly Extremities: extremities normal, atraumatic, no cyanosis or edema Skin: Skin color, texture, turgor normal. No rashes or lesions Lymph nodes: Cervical, supraclavicular, and axillary nodes normal. No abnormal inguinal nodes palpated Neurologic: Grossly normal  Pelvic: External genitalia:  no lesions              Urethra:  normal appearing urethra with no masses, tenderness or lesions              Bartholins and Skenes: normal                 Vagina: normal appearing vagina with normal color and discharge, no lesions              Cervix: no lesions              Pap taken: No. Bimanual Exam:  Uterus:  normal size, contour, position, consistency, mobility, non-tender              Adnexa: no mass, fullness, tenderness              Rectal exam: Yes.  .  Confirms.              Anus:  normal sphincter tone, no lesions  Chaperone was present for exam.  Assessment:   Well woman visit with normal exam. Status post TVH and anterior and posterior colporrhaphy.  Genuine stress incontinence.  Rectal fissures.  Fatigue.  Plan: Yearly mammogram recommended after age 60.  Recommended self breast exam.  Pap and HR HPV as above. Guidelines for Calcium, Vitamin D, regular exercise program including cardiovascular and weight bearing exercise. Check TSH, vit D, and  Hep C aby.  Refill of Valisone bid prn.  We had a good discussion about weight loss through diet and exercise.  Trial of Impressa for stress incontinence.  I discussed PT and midurethral sling also.  I would encourage the weight loss prior to any surgical correction.  Follow up annually and prn.       After visit summary provided.

## 2016-05-14 LAB — HEPATITIS C ANTIBODY: HCV Ab: NEGATIVE

## 2016-05-14 LAB — VITAMIN D 25 HYDROXY (VIT D DEFICIENCY, FRACTURES): Vit D, 25-Hydroxy: 43 ng/mL (ref 30–100)

## 2016-08-15 DIAGNOSIS — R7309 Other abnormal glucose: Secondary | ICD-10-CM

## 2016-08-15 HISTORY — DX: Other abnormal glucose: R73.09

## 2016-08-18 ENCOUNTER — Telehealth: Payer: Self-pay | Admitting: Obstetrics and Gynecology

## 2016-08-18 ENCOUNTER — Ambulatory Visit (INDEPENDENT_AMBULATORY_CARE_PROVIDER_SITE_OTHER): Payer: Managed Care, Other (non HMO) | Admitting: Certified Nurse Midwife

## 2016-08-18 ENCOUNTER — Encounter: Payer: Self-pay | Admitting: Certified Nurse Midwife

## 2016-08-18 VITALS — BP 120/64 | HR 70 | Temp 97.5°F | Resp 16 | Ht 65.0 in | Wt 196.0 lb

## 2016-08-18 DIAGNOSIS — B373 Candidiasis of vulva and vagina: Secondary | ICD-10-CM

## 2016-08-18 DIAGNOSIS — B3731 Acute candidiasis of vulva and vagina: Secondary | ICD-10-CM

## 2016-08-18 DIAGNOSIS — B9689 Other specified bacterial agents as the cause of diseases classified elsewhere: Secondary | ICD-10-CM

## 2016-08-18 DIAGNOSIS — N760 Acute vaginitis: Secondary | ICD-10-CM

## 2016-08-18 MED ORDER — METRONIDAZOLE 500 MG PO TABS: 500.0000 mg | ORAL_TABLET | Freq: Two times a day (BID) | ORAL | 0 refills | Status: DC

## 2016-08-18 MED ORDER — NYSTATIN-TRIAMCINOLONE 100000-0.1 UNIT/GM-% EX CREA: 1.0000 "application " | TOPICAL_CREAM | Freq: Two times a day (BID) | CUTANEOUS | 0 refills | Status: DC

## 2016-08-18 NOTE — Progress Notes (Signed)
51 y.o. Married Caucasian female G2P2002 here with complaint of vaginal symptoms of itching, burning, and increase discharge. Also has noted bumps that are tender inside "lip area" for 1-2 days. Describes discharge as white with odor of And on .Onset of symptoms about one week ago. Denies new personal products or vaginal dryness. No  STD concerns. Urinary symptoms none . Menopausal, no symptoms.   O:Healthy female WDWN Affect: normal, orientation x 3  Exam: Abdomen:soft, non tender Inguinal Lymph node: no enlargement or tenderness Pelvic exam: External genital: normal female with small red papules noted on left labia with exudate noted, Culture taken. Areas shown to patient in mirror BUS: negative Vagina:watery odorous discharge noted. Ph:4.5   ,Wet prep taken,  Cervix: absent Uterus:absent Adnexa:normal, non tender, no masses or fullness noted   Wet Prep results:KOH,Saline positive for clue cells/yeast   A:Normal pelvic exam BV Yeast vulvitis R/O MRSA, HSV   P:Discussed findings of BV and yeast vulvitis and etiology. Discussed Aveeno  sitz bath for comfort. Avoid moist clothes or pads for extended period of time. Avoid scratching areas if possible. Questions addressed. Discussed culture to make sure bumps are not MRSA or HSV. Questions addressed regarding transmission of if present. Rx: Flagyl see order Rx; Mycolog cream see order with instructions Lab: HSV culture, MRSA Culture Patient will be called with results when in.   Rv prn

## 2016-08-18 NOTE — Telephone Encounter (Signed)
Patient called and says she thinks she may have a yeast infection.  Would like something to be called into her pharmacy.

## 2016-08-18 NOTE — Patient Instructions (Signed)

## 2016-08-18 NOTE — Telephone Encounter (Signed)
Spoke with patient. Patient reports vaginal itching and burning that started a couple of days ago. Patient denies vaginal discharge or odor. Patient states she is able to feel a few little bumps "inside the vagina". Denies any urinary complaints or pain. Recommended OV for further evaluation. Patient request appt for this afternoon. Advised patient no available appointments for this afternoon with Dr. Quincy Simmonds, can schedule with another provider. Patient scheduled with Judy Jones, CNM 08/18/16 at 2pm. Patient is agreeable to date and time. Last AEX 05/13/16.  Routing to provider for final review. Patient is agreeable to disposition. Will close encounter.  Cc: Dr. Quincy Simmonds

## 2016-08-19 NOTE — Progress Notes (Signed)
Encounter reviewed Sueo Cullen, MD   

## 2016-08-20 LAB — MRSA CULTURE

## 2016-08-22 LAB — HERPES SIMPLEX VIRUS CULTURE: ORGANISM ID, BACTERIA: NOT DETECTED

## 2016-08-23 ENCOUNTER — Telehealth: Payer: Self-pay

## 2016-08-23 NOTE — Telephone Encounter (Signed)
-----   Message from Judy Jones, CNM sent at 08/23/2016  4:58 AM EST ----- Notify patient no MRSA or Herpes noted. Patient status?

## 2016-08-23 NOTE — Telephone Encounter (Signed)
Patient notified of results as written by provider 

## 2016-08-23 NOTE — Telephone Encounter (Signed)
lmtcb

## 2016-08-23 NOTE — Telephone Encounter (Signed)
Patient returning your call.

## 2017-01-17 ENCOUNTER — Ambulatory Visit (INDEPENDENT_AMBULATORY_CARE_PROVIDER_SITE_OTHER): Payer: 59 | Admitting: Obstetrics and Gynecology

## 2017-01-17 ENCOUNTER — Encounter: Payer: Self-pay | Admitting: Obstetrics and Gynecology

## 2017-01-17 VITALS — BP 116/60 | HR 76 | Resp 16 | Wt 199.0 lb

## 2017-01-17 DIAGNOSIS — B3731 Acute candidiasis of vulva and vagina: Secondary | ICD-10-CM

## 2017-01-17 DIAGNOSIS — B373 Candidiasis of vulva and vagina: Secondary | ICD-10-CM

## 2017-01-17 MED ORDER — FLUCONAZOLE 150 MG PO TABS: 150.0000 mg | ORAL_TABLET | Freq: Once | ORAL | 0 refills | Status: AC

## 2017-01-17 MED ORDER — BETAMETHASONE VALERATE 0.1 % EX OINT: TOPICAL_OINTMENT | CUTANEOUS | 0 refills | Status: DC

## 2017-01-17 NOTE — Patient Instructions (Signed)

## 2017-01-17 NOTE — Progress Notes (Signed)
GYNECOLOGY  VISIT   HPI: 51 y.o.   Married  Caucasian  female   G2P2002 with Patient's last menstrual period was 01/13/2006.   here for vaginal itching.    In January she was treated for BV and yeast in January, has had symptoms on and off since then. She c/o intermittent itching and swelling. She has been using mycolog intermittently since then.  The most recent flare started a few weeks ago, she has been using the cream some nights in the last few weeks. She has noticed some thick, creamy vaginal d/c. No odor.   GYNECOLOGIC HISTORY: Patient's last menstrual period was 01/13/2006. Contraception:Hysterectomy Menopausal hormone therapy: none        OB History    Gravida Para Term Preterm AB Living   2 2 2     2    SAB TAB Ectopic Multiple Live Births                     Patient Active Problem List   Diagnosis Date Noted  . IBS (irritable bowel syndrome)   . Genuine stress incontinence, female 04/23/2014  . ALLERGIC RHINITIS 05/30/2008  . COUGH 05/30/2008    Past Medical History:  Diagnosis Date  . Anxiety   . Arthritis   . Complication of anesthesia   . Family history of anesthesia complication    Mother get sick on her stomach  . GERD (gastroesophageal reflux disease)   . Hypertension   . IBS (irritable bowel syndrome)   . PONV (postoperative nausea and vomiting)   . Wears glasses     Past Surgical History:  Procedure Laterality Date  . CARPAL TUNNEL RELEASE Left 10/24/2014   Procedure: LEFT CARPAL TUNNEL ;  Surgeon: Roseanne Kaufman, MD;  Location: [facility];  Service: Orthopedics;  Laterality: Left;  . CESAREAN SECTION  1985  . CHOLECYSTECTOMY  1991  . COLONOSCOPY     Hx: of  . EYE SURGERY  2013   both cataracts  . ULNAR NERVE TRANSPOSITION Right 08/01/2013   Procedure: RIGHT LIMITED CARPAL TUNNEL RELEASE,RIGHT CUBITAL TUNNEL RELEASE; right cubital tunnel release INSITU ,EXCISION RIGHT MIDDLE FINGER MASS;  Surgeon: Roseanne Kaufman, MD;  Location: [place];  Service: Orthopedics;  Laterality: Right;  . ULNAR NERVE TRANSPOSITION Left 10/24/2014   Procedure: ULNAR NERVE DECOMPRESSION AT ELBOW;  Surgeon: Roseanne Kaufman, MD;  Location: [place];  Service: Orthopedics;  Laterality: Left;  Marland Kitchen VAGINAL HYSTERECTOMY  2007   ant/post repair  . WISDOM TOOTH EXTRACTION      Current Outpatient Prescriptions  Medication Sig Dispense Refill  . ALPRAZolam (XANAX) 0.25 MG tablet Take 0.25 mg by mouth daily as needed for anxiety.     . betamethasone valerate ointment (VALISONE) 0.1 % Apply a pea sized amount to skin 2 x a day for 1-2 weeks as needed 30 g 0  . Cholecalciferol (VITAMIN D) 2000 UNITS CAPS Take 2,000 Units by mouth daily.    Marland Kitchen esomeprazole (NEXIUM) 40 MG capsule Take 40 mg by mouth every evening.    . loratadine (CLARITIN) 10 MG tablet Take 10 mg by mouth daily.    Marland Kitchen losartan-hydrochlorothiazide (HYZAAR) 100-12.5 MG per tablet     . nystatin-triamcinolone (MYCOLOG II) cream Apply 1 application topically 2 (two) times daily. Apply to affected area BID for up to 7 days. 30 g 0   No current facility-administered medications for this visit.      ALLERGIES: Shellfish allergy; Flagyl [metronidazole]; Morphine;  Penicillins; and Percocet [oxycodone-acetaminophen]  Family History  Problem Relation Age of Onset  . Hypertension Mother   . Atrial fibrillation Mother   . Bipolar disorder Other     Social History   Social History  . Marital status: Married    Spouse name: N/A  . Number of children: N/A  . Years of education: N/A   Occupational History  . Not on file.   Social History Main Topics  . Smoking status: Never Smoker  . Smokeless tobacco: Never Used  . Alcohol use 0.5 oz/week    1 Standard drinks or equivalent per week     Comment: occ  . Drug use: No  . Sexual activity: Yes    Partners: Male    Birth control/ protection: Surgical     Comment: TVH--ovaries remain   Other Topics Concern  . Not on file    Social History Narrative  . No narrative on file    Review of Systems  Constitutional: Negative.   HENT: Negative.   Eyes: Negative.   Respiratory: Negative.   Cardiovascular: Negative.   Gastrointestinal: Negative.   Genitourinary:       Vaginal itching Abnormal discharge   Musculoskeletal: Negative.   Skin: Negative.   Neurological: Negative.   Endo/Heme/Allergies: Negative.   Psychiatric/Behavioral: Negative.     PHYSICAL EXAMINATION:    BP 116/60 (BP Location: Right Arm, Patient Position: Sitting, Cuff Size: Normal)   Pulse 76   Resp 16   Wt 199 lb (90.3 kg)   LMP 01/13/2006   BMI 33.12 kg/m     General appearance: alert, cooperative and appears stated age  Pelvic: External genitalia:  no lesions, + erythema              Urethra:  normal appearing urethra with no masses, tenderness or lesions              Bartholins and Skenes: normal                 Vagina: normal appearing vagina with normal color and discharge, no lesions              Cervix: absent  Chaperone was present for exam.  Wet prep: no clue, no trich, +++ wbc KOH: +++ yeast PH: 4   ASSESSMENT Yeast vaginitis    PLAN Treat with diflucan Valisone ointment BID for 1-2 weeks   An After Visit Summary was printed and given to the patient.  15 minutes face to face time of which over 50% was spent in counseling.

## 2017-05-19 ENCOUNTER — Ambulatory Visit (INDEPENDENT_AMBULATORY_CARE_PROVIDER_SITE_OTHER): Payer: 59 | Admitting: Obstetrics and Gynecology

## 2017-05-19 ENCOUNTER — Encounter: Payer: Self-pay | Admitting: Obstetrics and Gynecology

## 2017-05-19 VITALS — BP 116/74 | HR 80 | Resp 16 | Ht 65.0 in | Wt 204.0 lb

## 2017-05-19 DIAGNOSIS — Z01419 Encounter for gynecological examination (general) (routine) without abnormal findings: Secondary | ICD-10-CM

## 2017-05-19 DIAGNOSIS — N951 Menopausal and female climacteric states: Secondary | ICD-10-CM | POA: Diagnosis not present

## 2017-05-19 NOTE — Progress Notes (Signed)
51 y.o. G44P2002 Married Caucasian female here for annual exam.    Some hot flashes.   Wears a pad for leakage with cough/sneeze.  Mild leakage.  ROS - musculoskeletal swelling and muscle/joint pain.  Walking makes it worse. She is worried about fibromyalgia.  States depression for the last month due to pain and not feeling good. Feels like it is effecting her work.  Not feeling engaged. Not suicidal.  No hx depression or anxiety.   PCP: Antony Contras, MD   Patient's last menstrual period was 01/13/2006.           Sexually active: Yes.   female The current method of family planning is status post hysterectomy--ovaries remain.    Exercising: No.   Smoker:  no  Health Maintenance: Pap: 2012 normal History of abnormal Pap:  no MMG: 10-05-15 3D Density B/neg/BiRads1:TBC Colonoscopy: 2005 done to rule out Crohns disease and was negative.Was scheduled with Dr. Earlean Shawl last year but did not keep appt.   BMD:   n/a  Result  n/a TDaP:  2014 Gardasil:   no HIV:In pregnancy Hep C: 05-13-16 Neg Screening Labs:  Hb today: PCP, Urine today: not done   reports that she has never smoked. She has never used smokeless tobacco. She reports that she drinks about 0.5 oz of alcohol per week . She reports that she does not use drugs.  Past Medical History:  Diagnosis Date  . Anxiety   . Arthritis   . Complication of anesthesia   . Family history of anesthesia complication    Mother get sick on her stomach  . GERD (gastroesophageal reflux disease)   . Hypertension   . IBS (irritable bowel syndrome)   . PONV (postoperative nausea and vomiting)   . Wears glasses     Past Surgical History:  Procedure Laterality Date  . CARPAL TUNNEL RELEASE Left 10/24/2014   Procedure: LEFT CARPAL TUNNEL ;  Surgeon: Roseanne Kaufman, MD;  Location: [facility];  Service: Orthopedics;  Laterality: Left;  . CESAREAN SECTION  1985  . CHOLECYSTECTOMY  1991  . COLONOSCOPY     Hx: of  . EYE SURGERY   2013   both cataracts  . ULNAR NERVE TRANSPOSITION Right 08/01/2013   Procedure: RIGHT LIMITED CARPAL TUNNEL RELEASE,RIGHT CUBITAL TUNNEL RELEASE; right cubital tunnel release INSITU ,EXCISION RIGHT MIDDLE FINGER MASS;  Surgeon: Roseanne Kaufman, MD;  Location: [place] [facility];  Service: Orthopedics;  Laterality: Right;  . ULNAR NERVE TRANSPOSITION Left 10/24/2014   Procedure: ULNAR NERVE DECOMPRESSION AT ELBOW;  Surgeon: Roseanne Kaufman, MD;  Location: [place];  Service: Orthopedics;  Laterality: Left;  Judy Jones VAGINAL HYSTERECTOMY  2007   ant/post repair  . WISDOM TOOTH EXTRACTION      Current Outpatient Prescriptions  Medication Sig Dispense Refill  . ALPRAZolam (XANAX) 0.25 MG tablet Take 0.25 mg by mouth daily as needed for anxiety.     . betamethasone valerate ointment (VALISONE) 0.1 % Apply a pea sized amount to skin 2 x a day for 1-2 weeks as needed 30 g 0  . Cholecalciferol (VITAMIN D) 2000 UNITS CAPS Take 2,000 Units by mouth daily.    Judy Jones esomeprazole (NEXIUM) 40 MG capsule Take 40 mg by mouth every evening.    . loratadine (CLARITIN) 10 MG tablet Take 10 mg by mouth daily.    Judy Jones losartan-hydrochlorothiazide (HYZAAR) 100-12.5 MG per tablet     . nystatin-triamcinolone (MYCOLOG II) cream Apply 1 application topically 2 (two) times daily. Apply  to affected area BID for up to 7 days. 30 g 0   No current facility-administered medications for this visit.     Family History  Problem Relation Age of Onset  . Hypertension Mother   . Atrial fibrillation Mother   . Alzheimer's disease Father   . Bipolar disorder Other     ROS:  Pertinent items are noted in HPI.  Otherwise, a comprehensive ROS was negative.  Exam:   BP 116/74 (BP Location: Right Arm, Patient Position: Sitting, Cuff Size: Large)   Pulse 80   Resp 16   Ht 5\' 5"  (1.651 m)   Wt 204 lb (92.5 kg)   LMP 01/13/2006   BMI 33.95 kg/m     General appearance: alert, cooperative and appears stated age Head: Normocephalic,  without obvious abnormality, atraumatic Neck: no adenopathy, supple, symmetrical, trachea midline and thyroid normal to inspection and palpation Lungs: clear to auscultation bilaterally Breasts: normal appearance, no masses or tenderness, No nipple retraction or dimpling, No nipple discharge or bleeding, No axillary or supraclavicular adenopathy Heart: regular rate and rhythm Abdomen: soft, non-tender; no masses, no organomegaly Extremities: extremities normal, atraumatic, no cyanosis or edema Skin: Skin color, texture, turgor normal. No rashes or lesions Lymph nodes: Cervical, supraclavicular, and axillary nodes normal. No abnormal inguinal nodes palpated Neurologic: Grossly normal  Pelvic: External genitalia:  no lesions              Urethra:  normal appearing urethra with no masses, tenderness or lesions              Bartholins and Skenes: normal                 Vagina: normal appearing vagina with normal color and discharge, no lesions              Cervix:  Absent.               Pap taken: No. Bimanual Exam:  Uterus:  Absent.               Adnexa: no mass, fullness, tenderness              Rectal exam: Yes.  .  Confirms.              Anus:  normal sphincter tone, no lesions  Chaperone was present for exam.  Assessment:   Well woman visit with normal exam. Status post TVH and anterior and posterior colporrhaphy.  Genuine stress incontinence.  Menopausal symptoms.  Musculoskeletal pain.   Plan: Mammogram screening discussed.  She will schedule. Recommended self breast awareness. Pap and HR HPV as above. Guidelines for Calcium, Vitamin D, regular exercise program including cardiovascular and weight bearing exercise. Kegel's. Check FSH and E2.  Kegel's. She will FU with PCP regarding her musculoskeletal symptoms.  She will contact her GI for colonoscopy.  Follow up annually and prn.   After visit summary provided.

## 2017-05-19 NOTE — Patient Instructions (Signed)
EXERCISE AND DIET:  We recommended that you start or continue a regular exercise program for good health. Regular exercise means any activity that makes your heart beat faster and makes you sweat.  We recommend exercising at least 30 minutes per day at least 3 days a week, preferably 4 or 5.  We also recommend a diet low in fat and sugar.  Inactivity, poor dietary choices and obesity can cause diabetes, heart attack, stroke, and kidney damage, among others.    ALCOHOL AND SMOKING:  Women should limit their alcohol intake to no more than 7 drinks/beers/glasses of wine (combined, not each!) per week. Moderation of alcohol intake to this level decreases your risk of breast cancer and liver damage. And of course, no recreational drugs are part of a healthy lifestyle.  And absolutely no smoking or even second hand smoke. Most people know smoking can cause heart and lung diseases, but did you know it also contributes to weakening of your bones? Aging of your skin?  Yellowing of your teeth and nails?  CALCIUM AND VITAMIN D:  Adequate intake of calcium and Vitamin D are recommended.  The recommendations for exact amounts of these supplements seem to change often, but generally speaking 600 mg of calcium (either carbonate or citrate) and 800 units of Vitamin D per day seems prudent. Certain women may benefit from higher intake of Vitamin D.  If you are among these women, your doctor will have told you during your visit.    PAP SMEARS:  Pap smears, to check for cervical cancer or precancers,  have traditionally been done yearly, although recent scientific advances have shown that most women can have pap smears less often.  However, every woman still should have a physical exam from her gynecologist every year. It will include a breast check, inspection of the vulva and vagina to check for abnormal growths or skin changes, a visual exam of the cervix, and then an exam to evaluate the size and shape of the uterus and  ovaries.  And after 53 years of age, a rectal exam is indicated to check for rectal cancers. We will also provide age appropriate advice regarding health maintenance, like when you should have certain vaccines, screening for sexually transmitted diseases, bone density testing, colonoscopy, mammograms, etc.   MAMMOGRAMS:  All women over 40 years old should have a yearly mammogram. Many facilities now offer a "3D" mammogram, which may cost around $50 extra out of pocket. If possible,  we recommend you accept the option to have the 3D mammogram performed.  It both reduces the number of women who will be called back for extra views which then turn out to be normal, and it is better than the routine mammogram at detecting truly abnormal areas.    COLONOSCOPY:  Colonoscopy to screen for colon cancer is recommended for all women at age 50.  We know, you hate the idea of the prep.  We agree, BUT, having colon cancer and not knowing it is worse!!  Colon cancer so often starts as a polyp that can be seen and removed at colonscopy, which can quite literally save your life!  And if your first colonoscopy is normal and you have no family history of colon cancer, most women don't have to have it again for 10 years.  Once every ten years, you can do something that may end up saving your life, right?  We will be happy to help you get it scheduled when you are ready.    Be sure to check your insurance coverage so you understand how much it will cost.  It may be covered as a preventative service at no cost, but you should check your particular policy.      Kegel Exercises Kegel exercises help strengthen the muscles that support the rectum, vagina, small intestine, bladder, and uterus. Doing Kegel exercises can help:  Improve bladder and bowel control.  Improve sexual response.  Reduce problems and discomfort during pregnancy.  Kegel exercises involve squeezing your pelvic floor muscles, which are the same muscles you  squeeze when you try to stop the flow of urine. The exercises can be done while sitting, standing, or lying down, but it is best to vary your position. Phase 1 exercises 1. Squeeze your pelvic floor muscles tight. You should feel a tight lift in your rectal area. If you are a female, you should also feel a tightness in your vaginal area. Keep your stomach, buttocks, and legs relaxed. 2. Hold the muscles tight for up to 10 seconds. 3. Relax your muscles. Repeat this exercise 50 times a day or as many times as told by your health care provider. Continue to do this exercise for at least 4-6 weeks or for as long as told by your health care provider. This information is not intended to replace advice given to you by your health care provider. Make sure you discuss any questions you have with your health care provider. Document Released: 07/18/2012 Document Revised: 03/26/2016 Document Reviewed: 06/21/2015 Elsevier Interactive Patient Education  2018 [employer]  

## 2017-05-20 LAB — ESTRADIOL: Estradiol: <5 pg/mL

## 2017-05-20 LAB — FOLLICLE STIMULATING HORMONE: FSH: 8.9 m[IU]/mL

## 2017-06-07 ENCOUNTER — Ambulatory Visit: Payer: 59 | Admitting: Obstetrics and Gynecology

## 2017-06-07 NOTE — Progress Notes (Deleted)
GYNECOLOGY  VISIT   HPI: 51 y.o.   Married  Caucasian  female   G2P2002 with Patient's last menstrual period was 01/13/2006.   here for follow up from lab work.    GYNECOLOGIC HISTORY: Patient's last menstrual period was 01/13/2006. Contraception:  Hysterectomy--ovaries remain Menopausal hormone therapy: none Last mammogram:  10-05-15 3D Density B/neg/BiRads1:TBC Last pap smear:  2012 Neg        OB History    Gravida Para Term Preterm AB Living   2 2 2     2    SAB TAB Ectopic Multiple Live Births                     Patient Active Problem List   Diagnosis Date Noted  . IBS (irritable bowel syndrome)   . Genuine stress incontinence, female 04/23/2014  . ALLERGIC RHINITIS 05/30/2008  . COUGH 05/30/2008    Past Medical History:  Diagnosis Date  . Anxiety   . Arthritis   . Complication of anesthesia   . Family history of anesthesia complication    Mother get sick on her stomach  . GERD (gastroesophageal reflux disease)   . Hypertension   . IBS (irritable bowel syndrome)   . PONV (postoperative nausea and vomiting)   . Wears glasses     Past Surgical History:  Procedure Laterality Date  . CARPAL TUNNEL RELEASE Left 10/24/2014   Procedure: LEFT CARPAL TUNNEL ;  Surgeon: Roseanne Kaufman, MD;  Location: [facility];  Service: Orthopedics;  Laterality: Left;  . CESAREAN SECTION  1985  . CHOLECYSTECTOMY  1991  . COLONOSCOPY     Hx: of  . EYE SURGERY  2013   both cataracts  . ULNAR NERVE TRANSPOSITION Right 08/01/2013   Procedure: RIGHT LIMITED CARPAL TUNNEL RELEASE,RIGHT CUBITAL TUNNEL RELEASE; right cubital tunnel release INSITU ,EXCISION RIGHT MIDDLE FINGER MASS;  Surgeon: Roseanne Kaufman, MD;  Location: [place];  Service: Orthopedics;  Laterality: Right;  . ULNAR NERVE TRANSPOSITION Left 10/24/2014   Procedure: ULNAR NERVE DECOMPRESSION AT ELBOW;  Surgeon: Roseanne Kaufman, MD;  Location: [place];  Service: Orthopedics;  Laterality: Left;  Marland Kitchen  VAGINAL HYSTERECTOMY  2007   ant/post repair  . WISDOM TOOTH EXTRACTION      Current Outpatient Prescriptions  Medication Sig Dispense Refill  . ALPRAZolam (XANAX) 0.25 MG tablet Take 0.25 mg by mouth daily as needed for anxiety.     . betamethasone valerate ointment (VALISONE) 0.1 % Apply a pea sized amount to skin 2 x a day for 1-2 weeks as needed 30 g 0  . Cholecalciferol (VITAMIN D) 2000 UNITS CAPS Take 2,000 Units by mouth daily.    Marland Kitchen esomeprazole (NEXIUM) 40 MG capsule Take 40 mg by mouth every evening.    . loratadine (CLARITIN) 10 MG tablet Take 10 mg by mouth daily.    Marland Kitchen losartan-hydrochlorothiazide (HYZAAR) 100-12.5 MG per tablet     . nystatin-triamcinolone (MYCOLOG II) cream Apply 1 application topically 2 (two) times daily. Apply to affected area BID for up to 7 days. 30 g 0   No current facility-administered medications for this visit.      ALLERGIES: Shellfish allergy; Flagyl [metronidazole]; Morphine; Penicillins; and Percocet [oxycodone-acetaminophen]  Family History  Problem Relation Age of Onset  . Hypertension Mother   . Atrial fibrillation Mother   . Alzheimer's disease Father   . Bipolar disorder Other     Social History   Social History  . Marital status:  Married    Spouse name: N/A  . Number of children: N/A  . Years of education: N/A   Occupational History  . Not on file.   Social History Main Topics  . Smoking status: Never Smoker  . Smokeless tobacco: Never Used  . Alcohol use 0.5 oz/week    1 Standard drinks or equivalent per week     Comment: occ  . Drug use: No  . Sexual activity: Yes    Partners: Male    Birth control/ protection: Surgical     Comment: TVH--ovaries remain   Other Topics Concern  . Not on file   Social History Narrative  . No narrative on file    ROS:  Pertinent items are noted in HPI.  PHYSICAL EXAMINATION:    LMP 01/13/2006     General appearance: alert, cooperative and appears stated age Head:  Normocephalic, without obvious abnormality, atraumatic Neck: no adenopathy, supple, symmetrical, trachea midline and thyroid normal to inspection and palpation Lungs: clear to auscultation bilaterally Breasts: normal appearance, no masses or tenderness, No nipple retraction or dimpling, No nipple discharge or bleeding, No axillary or supraclavicular adenopathy Heart: regular rate and rhythm Abdomen: soft, non-tender, no masses,  no organomegaly Extremities: extremities normal, atraumatic, no cyanosis or edema Skin: Skin color, texture, turgor normal. No rashes or lesions Lymph nodes: Cervical, supraclavicular, and axillary nodes normal. No abnormal inguinal nodes palpated Neurologic: Grossly normal  Pelvic: External genitalia:  no lesions              Urethra:  normal appearing urethra with no masses, tenderness or lesions              Bartholins and Skenes: normal                 Vagina: normal appearing vagina with normal color and discharge, no lesions              Cervix: no lesions                Bimanual Exam:  Uterus:  normal size, contour, position, consistency, mobility, non-tender              Adnexa: no mass, fullness, tenderness              Rectal exam: {yes no:[id number]}.  Confirms.              Anus:  normal sphincter tone, no lesions  Chaperone was present for exam.  ASSESSMENT     PLAN     An After Visit Summary was printed and given to the patient.  ______ minutes face to face time of which over 50% was spent in counseling.

## 2017-06-09 NOTE — Progress Notes (Signed)
GYNECOLOGY  VISIT   HPI: 51 y.o.   Married  Caucasian  female   G2P2002 with Patient's last menstrual period was 01/13/2006.   here for follow up from lab work.    Some menopausal symptoms of hot flashes that are mild.  They were more strong prior to hysterectomy.  More mood symptoms of not feeling good.  She is feeling better overall.  She is cutting down on sugar.   FSH 8.9 and Estradiol < 5.0.   She does not have any pain with eating gluten.  Does have bloating.   GYNECOLOGIC HISTORY: Patient's last menstrual period was 01/13/2006. Contraception:  Hysterectomy--ovaries remain Menopausal hormone therapy:  none Last mammogram: 10-05-15 3D Density B/Neg/BiRads1:TBC.  Has appt at [facility].  Last pap smear: 2012 Neg        OB History    Gravida Para Term Preterm AB Living   2 2 2     2    SAB TAB Ectopic Multiple Live Births                     Patient Active Problem List   Diagnosis Date Noted  . IBS (irritable bowel syndrome)   . Genuine stress incontinence, female 04/23/2014  . ALLERGIC RHINITIS 05/30/2008  . COUGH 05/30/2008    Past Medical History:  Diagnosis Date  . Anxiety   . Arthritis   . Complication of anesthesia   . Family history of anesthesia complication    Mother get sick on her stomach  . GERD (gastroesophageal reflux disease)   . Hypertension   . IBS (irritable bowel syndrome)   . PONV (postoperative nausea and vomiting)   . Wears glasses     Past Surgical History:  Procedure Laterality Date  . CARPAL TUNNEL RELEASE Left 10/24/2014   Procedure: LEFT CARPAL TUNNEL ;  Surgeon: Roseanne Kaufman, MD;  Location: [place];  Service: Orthopedics;  Laterality: Left;  . CESAREAN SECTION  1985  . CHOLECYSTECTOMY  1991  . COLONOSCOPY     Hx: of  . EYE SURGERY  2013   both cataracts  . ULNAR NERVE TRANSPOSITION Right 08/01/2013   Procedure: RIGHT LIMITED CARPAL TUNNEL RELEASE,RIGHT CUBITAL TUNNEL RELEASE; right cubital tunnel release  INSITU ,EXCISION RIGHT MIDDLE FINGER MASS;  Surgeon: Roseanne Kaufman, MD;  Location: [place];  Service: Orthopedics;  Laterality: Right;  . ULNAR NERVE TRANSPOSITION Left 10/24/2014   Procedure: ULNAR NERVE DECOMPRESSION AT ELBOW;  Surgeon: Roseanne Kaufman, MD;  Location: [place];  Service: Orthopedics;  Laterality: Left;  Marland Kitchen VAGINAL HYSTERECTOMY  2007   ant/post repair  . WISDOM TOOTH EXTRACTION      Current Outpatient Prescriptions  Medication Sig Dispense Refill  . ALPRAZolam (XANAX) 0.25 MG tablet Take 0.25 mg by mouth daily as needed for anxiety.     . betamethasone valerate ointment (VALISONE) 0.1 % Apply a pea sized amount to skin 2 x a day for 1-2 weeks as needed 30 g 0  . Cholecalciferol (VITAMIN D) 2000 UNITS CAPS Take 2,000 Units by mouth daily.    Marland Kitchen esomeprazole (NEXIUM) 40 MG capsule Take 40 mg by mouth every evening.    . loratadine (CLARITIN) 10 MG tablet Take 10 mg by mouth daily.    Marland Kitchen losartan-hydrochlorothiazide (HYZAAR) 100-12.5 MG per tablet     . nystatin-triamcinolone (MYCOLOG II) cream Apply 1 application topically 2 (two) times daily. Apply to affected area BID for up to 7 days. 30 g 0  No current facility-administered medications for this visit.      ALLERGIES: Shellfish allergy; Flagyl [metronidazole]; Morphine; Penicillins; and Percocet [oxycodone-acetaminophen]  Family History  Problem Relation Age of Onset  . Hypertension Mother   . Atrial fibrillation Mother   . Alzheimer's disease Father   . Bipolar disorder Other     Social History   Social History  . Marital status: Married    Spouse name: N/A  . Number of children: N/A  . Years of education: N/A   Occupational History  . Not on file.   Social History Main Topics  . Smoking status: Never Smoker  . Smokeless tobacco: Never Used  . Alcohol use 0.5 oz/week    1 Standard drinks or equivalent per week     Comment: occ  . Drug use: No  . Sexual activity: Yes    Partners: Male     Birth control/ protection: Surgical     Comment: TVH--ovaries remain   Other Topics Concern  . Not on file   Social History Narrative  . No narrative on file    ROS:  Pertinent items are noted in HPI.  PHYSICAL EXAMINATION:    BP 130/76 (BP Location: Right Arm, Patient Position: Sitting, Cuff Size: Normal)   Pulse 88   Ht 5\' 5"  (1.651 m)   Wt 198 lb (89.8 kg)   LMP 01/13/2006   BMI 32.95 kg/m     General appearance: alert, cooperative and appears stated age  ASSESSMENT  Menopausal symptoms. Low estradiol and low FSH.  Abdominal bloating.   PLAN  Discussed lab findings and potential etiologies of this combination - Dm. Celiac, hypothalamic conditions. Will repeat FSH, E2 and check BMP and tissue transglutaminase IgA.  I discussed estrogen therapy and potential risks of DVT, PE, stroke, and possible breast cancer.  If lab testing is normal, will proceed with brain MRI.    An After Visit Summary was printed and given to the patient.  __15____ minutes face to face time of which over 50% was spent in counseling.

## 2017-06-12 ENCOUNTER — Ambulatory Visit (INDEPENDENT_AMBULATORY_CARE_PROVIDER_SITE_OTHER): Payer: 59 | Admitting: Obstetrics and Gynecology

## 2017-06-12 ENCOUNTER — Encounter: Payer: Self-pay | Admitting: Obstetrics and Gynecology

## 2017-06-12 VITALS — BP 130/76 | HR 88 | Ht 65.0 in | Wt 198.0 lb

## 2017-06-12 DIAGNOSIS — R7309 Other abnormal glucose: Secondary | ICD-10-CM

## 2017-06-12 DIAGNOSIS — N951 Menopausal and female climacteric states: Secondary | ICD-10-CM

## 2017-06-12 DIAGNOSIS — R140 Abdominal distension (gaseous): Secondary | ICD-10-CM | POA: Diagnosis not present

## 2017-06-12 DIAGNOSIS — R7989 Other specified abnormal findings of blood chemistry: Secondary | ICD-10-CM

## 2017-06-13 LAB — BASIC METABOLIC PANEL
BUN/Creatinine Ratio: 16 (ref 9–23)
BUN: 16 mg/dL (ref 6–24)
CO2: 23 mmol/L (ref 20–29)
CREATININE: 1.01 mg/dL — AB (ref 0.57–1.00)
Calcium: 9.3 mg/dL (ref 8.7–10.2)
Chloride: 100 mmol/L (ref 96–106)
GFR calc Af Amer: 73 mL/min/{1.73_m2} (ref >59)
GFR, EST NON AFRICAN AMERICAN: 64 mL/min/{1.73_m2} (ref >59)
Glucose: 107 mg/dL — ABNORMAL HIGH (ref 65–99)
Potassium: 4.7 mmol/L (ref 3.5–5.2)
Sodium: 139 mmol/L (ref 134–144)

## 2017-06-13 LAB — TISSUE TRANSGLUTAMINASE, IGA: Transglutaminase IgA: <2 U/mL (ref 0–3)

## 2017-06-13 LAB — FOLLICLE STIMULATING HORMONE: FSH: 3.7 m[IU]/mL

## 2017-06-13 LAB — ESTRADIOL: Estradiol: 24 pg/mL

## 2017-06-14 NOTE — Addendum Note (Signed)
Addended by: Yisroel Ramming, Dietrich Pates E on: 06/14/2017 04:54 PM   Modules accepted: Orders

## 2017-06-20 ENCOUNTER — Encounter: Payer: Self-pay | Admitting: Obstetrics and Gynecology

## 2017-06-26 ENCOUNTER — Other Ambulatory Visit (INDEPENDENT_AMBULATORY_CARE_PROVIDER_SITE_OTHER): Payer: 59

## 2017-06-26 ENCOUNTER — Other Ambulatory Visit: Payer: Self-pay

## 2017-06-26 DIAGNOSIS — R7989 Other specified abnormal findings of blood chemistry: Secondary | ICD-10-CM

## 2017-06-26 DIAGNOSIS — R7309 Other abnormal glucose: Secondary | ICD-10-CM

## 2017-06-26 DIAGNOSIS — R7889 Finding of other specified substances, not normally found in blood: Secondary | ICD-10-CM

## 2017-06-26 NOTE — Progress Notes (Signed)
Lab orders for Creatinine and Hemoglobin A1c placed for patient's lab recheck today.

## 2017-06-27 LAB — HEMOGLOBIN A1C
ESTIMATED AVERAGE GLUCOSE: 131 mg/dL
Hgb A1c MFr Bld: 6.2 % — ABNORMAL HIGH (ref 4.8–5.6)

## 2017-06-27 LAB — CREATININE, SERUM
CREATININE: 0.85 mg/dL (ref 0.57–1.00)
GFR calc Af Amer: 90 mL/min/{1.73_m2} (ref >59)
GFR, EST NON AFRICAN AMERICAN: 78 mL/min/{1.73_m2} (ref >59)

## 2017-06-28 ENCOUNTER — Encounter: Payer: Self-pay | Admitting: Obstetrics and Gynecology

## 2018-05-25 ENCOUNTER — Ambulatory Visit (INDEPENDENT_AMBULATORY_CARE_PROVIDER_SITE_OTHER): Payer: 59 | Admitting: Obstetrics and Gynecology

## 2018-05-25 ENCOUNTER — Other Ambulatory Visit: Payer: Self-pay

## 2018-05-25 ENCOUNTER — Encounter: Payer: Self-pay | Admitting: Obstetrics and Gynecology

## 2018-05-25 VITALS — BP 130/82 | HR 100 | Resp 20 | Ht 64.5 in | Wt 198.0 lb

## 2018-05-25 DIAGNOSIS — Z01419 Encounter for gynecological examination (general) (routine) without abnormal findings: Secondary | ICD-10-CM

## 2018-05-25 MED ORDER — BETAMETHASONE VALERATE 0.1 % EX OINT: TOPICAL_OINTMENT | CUTANEOUS | 0 refills | Status: DC

## 2018-05-25 NOTE — Patient Instructions (Signed)
EXERCISE AND DIET:  We recommended that you start or continue a regular exercise program for good health. Regular exercise means any activity that makes your heart beat faster and makes you sweat.  We recommend exercising at least 30 minutes per day at least 3 days a week, preferably 4 or 5.  We also recommend a diet low in fat and sugar.  Inactivity, poor dietary choices and obesity can cause diabetes, heart attack, stroke, and kidney damage, among others.    ALCOHOL AND SMOKING:  Women should limit their alcohol intake to no more than 7 drinks/beers/glasses of wine (combined, not each!) per week. Moderation of alcohol intake to this level decreases your risk of breast cancer and liver damage. And of course, no recreational drugs are part of a healthy lifestyle.  And absolutely no smoking or even second hand smoke. Most people know smoking can cause heart and lung diseases, but did you know it also contributes to weakening of your bones? Aging of your skin?  Yellowing of your teeth and nails?  CALCIUM AND VITAMIN D:  Adequate intake of calcium and Vitamin D are recommended.  The recommendations for exact amounts of these supplements seem to change often, but generally speaking 600 mg of calcium (either carbonate or citrate) and 800 units of Vitamin D per day seems prudent. Certain women may benefit from higher intake of Vitamin D.  If you are among these women, your doctor will have told you during your visit.    PAP SMEARS:  Pap smears, to check for cervical cancer or precancers,  have traditionally been done yearly, although recent scientific advances have shown that most women can have pap smears less often.  However, every woman still should have a physical exam from her gynecologist every year. It will include a breast check, inspection of the vulva and vagina to check for abnormal growths or skin changes, a visual exam of the cervix, and then an exam to evaluate the size and shape of the uterus and  ovaries.  And after 54 years of age, a rectal exam is indicated to check for rectal cancers. We will also provide age appropriate advice regarding health maintenance, like when you should have certain vaccines, screening for sexually transmitted diseases, bone density testing, colonoscopy, mammograms, etc.   MAMMOGRAMS:  All women over 40 years old should have a yearly mammogram. Many facilities now offer a "3D" mammogram, which may cost around $50 extra out of pocket. If possible,  we recommend you accept the option to have the 3D mammogram performed.  It both reduces the number of women who will be called back for extra views which then turn out to be normal, and it is better than the routine mammogram at detecting truly abnormal areas.    COLONOSCOPY:  Colonoscopy to screen for colon cancer is recommended for all women at age 50.  We know, you hate the idea of the prep.  We agree, BUT, having colon cancer and not knowing it is worse!!  Colon cancer so often starts as a polyp that can be seen and removed at colonscopy, which can quite literally save your life!  And if your first colonoscopy is normal and you have no family history of colon cancer, most women don't have to have it again for 10 years.  Once every ten years, you can do something that may end up saving your life, right?  We will be happy to help you get it scheduled when you are ready.    Be sure to check your insurance coverage so you understand how much it will cost.  It may be covered as a preventative service at no cost, but you should check your particular policy.     Menopause and Herbal Products What is menopause? Menopause is the normal time of life when menstrual periods decrease in frequency and eventually stop completely. This process can take several years for some women. Menopause is complete when you have had an absence of menstruation for a full year since your last menstrual period. It usually occurs between the ages of 48 and  55. It is not common for menopause to begin before the age of 40. During menopause, your body stops producing the female hormones estrogen and progesterone. Common symptoms associated with this loss of hormones (vasomotor symptoms) are:  Hot flashes.  Hot flushes.  Night sweats.  Other common symptoms and complications of menopause include:  Decrease in sex drive.  Vaginal dryness and thinning of the walls of the vagina. This can make sex painful.  Dryness of the skin and development of wrinkles.  Headaches.  Tiredness.  Irritability.  Memory problems.  Weight gain.  Bladder infections.  Hair growth on the face and chest.  Inability to reproduce offspring (infertility).  Loss of density in the bones (osteoporosis) increasing your risk for breaks (fractures).  Depression.  Hardening and narrowing of the arteries (atherosclerosis). This increases your risk of heart attack and stroke.  What treatment options are available? There are many treatment choices for menopause symptoms. The most common treatment is hormone replacement therapy. Many alternative therapies for menopause are emerging, including the use of herbal products. These supplements can be found in the form of herbs, teas, oils, tinctures, and pills. Common herbal supplements for menopause are made from plants that contain phytoestrogens. Phytoestrogens are compounds that occur naturally in plants and plant products. They act like estrogen in the body. Foods and herbs that contain phytoestrogens include:  Soy.  Flax seeds.  Red clover.  Ginseng.  What menopause symptoms may be helped if I use herbal products?  Vasomotor symptoms. These may be helped by: ? Soy. Some studies show that soy may have a moderate benefit for hot flashes. ? Black cohosh. There is limited evidence indicating this may be beneficial for hot flashes.  Symptoms that are related to heart and blood vessel disease. These may be  helped by soy. Studies have shown that soy can help to lower cholesterol.  Depression. This may be helped by: ? St. John's wort. There is limited evidence that shows this may help mild to moderate depression. ? Black cohosh. There is evidence that this may help depression and mood swings.  Osteoporosis. Soy may help to decrease bone loss that is associated with menopause and may prevent osteoporosis. Limited evidence indicates that red clover may offer some bone loss protection as well. Other herbal products that are commonly used during menopause lack enough evidence to support their use as a replacement for conventional menopause therapies. These products include evening primrose, ginseng, and red clover. What are the cases when herbal products should not be used during menopause? Do not use herbal products during menopause without your health care provider's approval if:  You are taking medicine.  You have a preexisting liver condition.  Are there any risks in my taking herbal products during menopause? If you choose to use herbal products to help with symptoms of menopause, keep in mind that:  Different supplements have different and unmeasured   amounts of herbal ingredients.  Herbal products are not regulated the same way that medicines are.  Concentrations of herbs may vary depending on the way they are prepared. For example, the concentration may be different in a pill, tea, oil, and tincture.  Little is known about the risks of using herbal products, particularly the risks of long-term use.  Some herbal supplements can be harmful when combined with certain medicines.  Most commonly reported side effects of herbal products are mild. However, if used improperly, many herbal supplements can cause serious problems. Talk to your health care provider before starting any herbal product. If problems develop, stop taking the supplement and let your health care provider know. This  information is not intended to replace advice given to you by your health care provider. Make sure you discuss any questions you have with your health care provider. Document Released: 01/18/2008 Document Revised: 06/28/2016 Document Reviewed: 01/14/2014 Elsevier Interactive Patient Education  2017 [employer]vier Inc.  

## 2018-05-25 NOTE — Progress Notes (Signed)
52 y.o. G60P2002 Married Caucasian female here for annual exam.    [place] 3.7and E2 24 06/12/17.  Just had peridontal surgery.   Feels hot at night.  No hot flashes during the day.  The heat can affect her sleep.   Using Valisone periodically on her rectal area for irritation. This has been sensitive since she had surgery.  Also concerned about anal fissures.  Labs with PCP.   PCP: Antony Contras, MD     Patient's last menstrual period was 01/13/2006.           Sexually active: Yes.    The current method of family planning is status post hysterectomy--ovaries remain.    Exercising: No.  Walking.   Smoker:  no  Health Maintenance: Pap:  2012 normal History of abnormal Pap:  no MMG: 06-13-17 3D/Density C/Neg/Birads2 Colonoscopy:  2005 done to rule out Crohns disease and was negative.Was scheduled with Dr. Earlean Shawl last year but did not keep appt.  She will reschedule this.  BMD:   n/a  Result  n/a TDaP:  2014 Gardasil:   no HIV: In pregnancy Hep C: 05-13-16 Neg Screening Labs:  Hb today: PCP  Flu vaccine on 05/17/18.    reports that she has never smoked. She has never used smokeless tobacco. She reports that she drank alcohol. She reports that she does not use drugs.  Past Medical History:  Diagnosis Date  . Anxiety   . Arthritis   . Complication of anesthesia   . Elevated hemoglobin A1c 2018  . Family history of anesthesia complication    Mother get sick on her stomach  . GERD (gastroesophageal reflux disease)   . Hypertension   . IBS (irritable bowel syndrome)   . PONV (postoperative nausea and vomiting)   . Wears glasses     Past Surgical History:  Procedure Laterality Date  . CARPAL TUNNEL RELEASE Left 10/24/2014   Procedure: LEFT CARPAL TUNNEL ;  Surgeon: Roseanne Kaufman, MD;  Location: [place];  Service: Orthopedics;  Laterality: Left;  . CESAREAN SECTION  1985  . CHOLECYSTECTOMY  1991  . COLONOSCOPY     Hx: of  . EYE SURGERY  2013   both  cataracts  . ULNAR NERVE TRANSPOSITION Right 08/01/2013   Procedure: RIGHT LIMITED CARPAL TUNNEL RELEASE,RIGHT CUBITAL TUNNEL RELEASE; right cubital tunnel release INSITU ,EXCISION RIGHT MIDDLE FINGER MASS;  Surgeon: Roseanne Kaufman, MD;  Location: [place];  Service: Orthopedics;  Laterality: Right;  . ULNAR NERVE TRANSPOSITION Left 10/24/2014   Procedure: ULNAR NERVE DECOMPRESSION AT ELBOW;  Surgeon: Roseanne Kaufman, MD;  Location: [place];  Service: Orthopedics;  Laterality: Left;  Marland Kitchen VAGINAL HYSTERECTOMY  2007   ant/post repair  . WISDOM TOOTH EXTRACTION      Current Outpatient Medications  Medication Sig Dispense Refill  . ALPRAZolam (XANAX) 0.25 MG tablet Take 0.25 mg by mouth daily as needed for anxiety.     . betamethasone valerate ointment (VALISONE) 0.1 % Apply a pea sized amount to skin 2 x a day for 1-2 weeks as needed 30 g 0  . Cholecalciferol (VITAMIN D) 2000 UNITS CAPS Take 2,000 Units by mouth daily.    Marland Kitchen esomeprazole (NEXIUM) 40 MG capsule Take 40 mg by mouth every evening.    . loratadine (CLARITIN) 10 MG tablet Take 10 mg by mouth daily.    Marland Kitchen losartan-hydrochlorothiazide (HYZAAR) 100-12.5 MG per tablet     . nystatin-triamcinolone (MYCOLOG II) cream Apply 1 application topically 2 (  two) times daily. Apply to affected area BID for up to 7 days. 30 g 0   No current facility-administered medications for this visit.     Family History  Problem Relation Age of Onset  . Hypertension Mother   . Atrial fibrillation Mother   . Alzheimer's disease Father   . Bipolar disorder Other     Review of Systems  Constitutional: Negative.   HENT: Negative.   Eyes: Negative.   Cardiovascular: Negative.   Gastrointestinal: Negative.   Endocrine: Negative.   Genitourinary: Negative.   Musculoskeletal: Negative.   Skin: Negative.   Allergic/Immunologic: Negative.   Neurological: Negative.   Hematological: Negative.   Psychiatric/Behavioral: Negative.     Exam:    BP 130/82 (BP Location: Right Arm, Patient Position: Sitting, Cuff Size: Normal)   Pulse 100   Resp 20   Ht 5' 4.5" (1.638 m)   Wt 198 lb (89.8 kg)   LMP 01/13/2006   BMI 33.46 kg/m     General appearance: alert, cooperative and appears stated age Head: Normocephalic, without obvious abnormality, atraumatic Neck: no adenopathy, supple, symmetrical, trachea midline and thyroid normal to inspection and palpation Lungs: clear to auscultation bilaterally Breasts: normal appearance, no masses or tenderness, No nipple retraction or dimpling, No nipple discharge or bleeding, No axillary or supraclavicular adenopathy Heart: regular rate and rhythm Abdomen: soft, non-tender; no masses, no organomegaly Extremities: extremities normal, atraumatic, no cyanosis or edema Skin: Skin color, texture, turgor normal. No rashes or lesions Lymph nodes: Cervical, supraclavicular, and axillary nodes normal. No abnormal inguinal nodes palpated Neurologic: Grossly normal  Pelvic: External genitalia:  no lesions              Urethra:  normal appearing urethra with no masses, tenderness or lesions              Bartholins and Skenes: normal                 Vagina: normal appearing vagina with normal color and discharge, no lesions              Cervix: absent              Pap taken: No. Bimanual Exam:  Uterus:   absent              Adnexa: no mass, fullness, tenderness              Rectal exam: Yes.  .  Confirms.              Anus:  normal sphincter tone, small fissure noted in area of pale tissue.   Chaperone was present for exam.  Assessment:   Well woman visit with normal exam. Status post TVH and anterior and posterior colporrhaphy.  Perimenopausal female. Prediabetic. A1C 6.2 2018.  Anal irritation.  Possible fissure.  Plan: Mammogram screening.  She will schedule.  Recommended self breast awareness. Pap and HR HPV as above. Guidelines for Calcium, Vitamin D, regular exercise program including  cardiovascular and weight bearing exercise. We discussed herbal remedies for her perimenopausal symptoms.  Refill of Valisone.  She will follow up with GI. If perianal symptoms persist, return for biopsy.  Labs with PCP.  Follow up annually and prn.   After visit summary provided.

## 2018-07-15 DIAGNOSIS — C801 Malignant (primary) neoplasm, unspecified: Secondary | ICD-10-CM | POA: Insufficient documentation

## 2018-07-15 HISTORY — PX: MOHS SURGERY: SUR867

## 2018-07-15 HISTORY — DX: Malignant (primary) neoplasm, unspecified: C80.1

## 2019-05-23 ENCOUNTER — Other Ambulatory Visit: Payer: Self-pay

## 2019-05-23 NOTE — Progress Notes (Signed)
53 y.o. G37P2002 Married Caucasian female here for annual exam.   Patient complaining of "night hot flashes" and decreased libido.  It is disrupting her sleep.   Can leak a little urine if she laughs or sneezes.  She feels like she is not emptying well.  Working from home.  Feeling stress and wants to talk to somebody.  States she does feel safe and denies suicidal ideation.  Daughter married in [place] in September.  PCP: Antony Contras, MD    Patient's last menstrual period was 01/13/2006.           Sexually active: Yes.    The current method of family planning is status post hysterectomy--still has ovaries.    Exercising: No.  The patient does not participate in regular exercise at present. Smoker:  no  Health Maintenance: Pap:  2012 normal History of abnormal Pap:  no MMG: 06-13-17 3D/Neg/density C/BiRads2 --knows needs to schedule Colonoscopy: 2005 done to rule out Crohns disease and was negative.--knows she needs to schedule BMD:   n/a  Result  n/a TDaP: 2014 Gardasil:   n/a HIV:Neg in pregnancy Hep C:05-13-16 Neg Screening Labs:  PCP on Nov 7.  Flu vaccine: completed 2 weeks ago at [facility].    reports that she has never smoked. She has never used smokeless tobacco. She reports previous alcohol use. She reports that she does not use drugs.  Past Medical History:  Diagnosis Date  . Anxiety   . Arthritis   . 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 Complication of anesthesia   . Elevated hemoglobin A1c 2018  . Family history of anesthesia complication    Mother get sick on her stomach  . GERD (gastroesophageal reflux disease)   . Hypertension   . IBS (irritable bowel syndrome)   . PONV (postoperative nausea and vomiting)   . Wears glasses     Past Surgical History:  Procedure Laterality Date  . CARPAL TUNNEL RELEASE Left 10/24/2014   Procedure: LEFT CARPAL TUNNEL ;  Surgeon: Roseanne Kaufman, MD;  Location: [facility];  Service: Orthopedics;   Laterality: Left;  . CESAREAN SECTION  1985  . CHOLECYSTECTOMY  1991  . COLONOSCOPY     Hx: of  . EYE SURGERY  2013   both cataracts  . MOHS SURGERY  07/2018   nose--basal cell  . ULNAR NERVE TRANSPOSITION Right 08/01/2013   Procedure: RIGHT LIMITED CARPAL TUNNEL RELEASE,RIGHT CUBITAL TUNNEL RELEASE; right cubital tunnel release INSITU ,EXCISION RIGHT MIDDLE FINGER MASS;  Surgeon: Roseanne Kaufman, MD;  Location: [facility];  Service: Orthopedics;  Laterality: Right;  . ULNAR NERVE TRANSPOSITION Left 10/24/2014   Procedure: ULNAR NERVE DECOMPRESSION AT ELBOW;  Surgeon: Roseanne Kaufman, MD;  Location: [facility];  Service: Orthopedics;  Laterality: Left;  Marland Kitchen VAGINAL HYSTERECTOMY  2007   ant/post repair  . WISDOM TOOTH EXTRACTION      Current Outpatient Medications  Medication Sig Dispense Refill  . ALPRAZolam (XANAX) 0.25 MG tablet Take 0.25 mg by mouth daily as needed for anxiety.     . betamethasone valerate ointment (VALISONE) 0.1 % Apply a pea sized amount to skin 2 x a day for 1-2 weeks as needed 30 g 0  . Cholecalciferol (VITAMIN D) 2000 UNITS CAPS Take 2,000 Units by mouth daily.    Marland Kitchen esomeprazole (NEXIUM) 40 MG capsule Take 40 mg by mouth every evening.    . loratadine (CLARITIN) 10 MG tablet Take 10 mg by mouth daily.    Marland Kitchen  losartan-hydrochlorothiazide (HYZAAR) 100-12.5 MG per tablet     . nystatin-triamcinolone (MYCOLOG II) cream Apply 1 application topically 2 (two) times daily. Apply to affected area BID for up to 7 days. 30 g 0   No current facility-administered medications for this visit.     Family History  Problem Relation Age of Onset  . Hypertension Mother   . Atrial fibrillation Mother   . Alzheimer's disease Father   . Bipolar disorder Other     Review of Systems  All other systems reviewed and are negative.   Exam:   BP (!) 158/80 (Cuff Size: Large)   Pulse 90   Temp 97.9 F (36.6 C) (Temporal)   Resp 14   Ht 5' 4.5" (1.638 m)   Wt 195 lb 3.2  oz (88.5 kg)   LMP 01/13/2006   BMI 32.99 kg/m     General appearance: alert, cooperative and appears stated age Head: normocephalic, without obvious abnormality, atraumatic Neck: no adenopathy, supple, symmetrical, trachea midline and thyroid normal to inspection and palpation Lungs: clear to auscultation bilaterally Breasts: normal appearance, no masses or tenderness, No nipple retraction or dimpling, No nipple discharge or bleeding, No axillary adenopathy Heart: regular rate and rhythm Abdomen: soft, non-tender; no masses, no organomegaly Extremities: extremities normal, atraumatic, no cyanosis or edema Skin: skin color, texture, turgor normal. No rashes or lesions Lymph nodes: cervical, supraclavicular, and axillary nodes normal. Neurologic: grossly normal  Pelvic: External genitalia:  no lesions              No abnormal inguinal nodes palpated.              Urethra:  normal appearing urethra with no masses, tenderness or lesions              Bartholins and Skenes: normal                 Vagina: normal appearing vagina with normal color and discharge, no lesions              Cervix:absent              Pap taken: No. Bimanual Exam:  Uterus:  absent              Adnexa: no mass, fullness, tenderness              Rectal exam: Yes.  .  Confirms.              Anus:  normal sphincter tone, no lesions  Chaperone was present for exam.  Assessment:   Well woman visit with normal exam. Status post TVH and anterior and posterior colporrhaphy.  Ovaries remain. Perimenopausal female.  Menopausal symptoms.  Prediabetic.  Mixed incontinence.  Plan: Mammogram screening discussed. Self breast awareness reviewed. Pap and HR HPV as above. Guidelines for Calcium, Vitamin D, regular exercise program including cardiovascular and weight bearing exercise. [place], E2.  We discussed tx options for menopausal symptoms - estrogen tx, SSRI/SNRI.  Brochure on menopause.  Brochure on [employer].  We discussed urinary incontinence.  She will let me know if she needs anything further.   Follow up annually and prn.   After visit summary provided.

## 2019-05-27 ENCOUNTER — Encounter: Payer: Self-pay | Admitting: Obstetrics and Gynecology

## 2019-05-27 ENCOUNTER — Ambulatory Visit (INDEPENDENT_AMBULATORY_CARE_PROVIDER_SITE_OTHER): Payer: 59 | Admitting: Obstetrics and Gynecology

## 2019-05-27 ENCOUNTER — Other Ambulatory Visit: Payer: Self-pay

## 2019-05-27 VITALS — BP 158/80 | HR 90 | Temp 97.9°F | Resp 14 | Ht 64.5 in | Wt 195.2 lb

## 2019-05-27 DIAGNOSIS — N951 Menopausal and female climacteric states: Secondary | ICD-10-CM | POA: Diagnosis not present

## 2019-05-27 DIAGNOSIS — N3946 Mixed incontinence: Secondary | ICD-10-CM

## 2019-05-27 DIAGNOSIS — Z01419 Encounter for gynecological examination (general) (routine) without abnormal findings: Secondary | ICD-10-CM

## 2019-05-27 NOTE — Patient Instructions (Signed)

## 2019-05-28 LAB — FOLLICLE STIMULATING HORMONE: FSH: 48.4 m[IU]/mL

## 2019-05-28 LAB — ESTRADIOL: Estradiol: <5 pg/mL

## 2019-06-04 ENCOUNTER — Other Ambulatory Visit: Payer: Self-pay | Admitting: Obstetrics and Gynecology

## 2019-06-04 DIAGNOSIS — Z1231 Encounter for screening mammogram for malignant neoplasm of breast: Secondary | ICD-10-CM

## 2019-06-05 ENCOUNTER — Telehealth: Payer: Self-pay

## 2019-06-05 NOTE — Telephone Encounter (Signed)
Called patient and per DPR, left detailed message with results as per Dr.Silva's note below.

## 2019-06-05 NOTE — Telephone Encounter (Signed)
-----   Message from Nunzio Cobbs, MD sent at 05/29/2019 12:54 PM EDT ----- Please inform patient of her hormonal testing which confirms menopause.  If she wishes to do any treatment for her symptoms, please have her do a follow up visit with me after her mammogram is completed.   I am highlighting this result so you know to contact the patient.

## 2019-06-11 ENCOUNTER — Other Ambulatory Visit: Payer: Self-pay

## 2019-06-11 ENCOUNTER — Ambulatory Visit
Admission: RE | Admit: 2019-06-11 | Discharge: 2019-06-11 | Disposition: A | Discharge status: Home / Self Care | Payer: 59 | Source: Ambulatory Visit | Attending: Obstetrics and Gynecology | Admitting: Obstetrics and Gynecology

## 2019-06-11 DIAGNOSIS — Z1231 Encounter for screening mammogram for malignant neoplasm of breast: Secondary | ICD-10-CM

## 2019-06-14 ENCOUNTER — Other Ambulatory Visit: Payer: Self-pay | Admitting: Obstetrics and Gynecology

## 2019-06-14 DIAGNOSIS — R928 Other abnormal and inconclusive findings on diagnostic imaging of breast: Secondary | ICD-10-CM

## 2019-06-17 ENCOUNTER — Other Ambulatory Visit: Payer: Self-pay | Admitting: Obstetrics and Gynecology

## 2019-06-17 ENCOUNTER — Other Ambulatory Visit: Payer: Self-pay

## 2019-06-17 ENCOUNTER — Ambulatory Visit
Admission: RE | Admit: 2019-06-17 | Discharge: 2019-06-17 | Disposition: A | Discharge status: Home / Self Care | Payer: 59 | Source: Ambulatory Visit | Attending: Obstetrics and Gynecology | Admitting: Obstetrics and Gynecology

## 2019-06-17 DIAGNOSIS — R928 Other abnormal and inconclusive findings on diagnostic imaging of breast: Secondary | ICD-10-CM

## 2019-06-17 DIAGNOSIS — N6489 Other specified disorders of breast: Secondary | ICD-10-CM

## 2019-06-18 ENCOUNTER — Ambulatory Visit
Admission: RE | Admit: 2019-06-18 | Discharge: 2019-06-18 | Disposition: A | Discharge status: Home / Self Care | Payer: 59 | Source: Ambulatory Visit | Attending: Obstetrics and Gynecology | Admitting: Obstetrics and Gynecology

## 2019-06-18 DIAGNOSIS — N6489 Other specified disorders of breast: Secondary | ICD-10-CM

## 2019-11-27 ENCOUNTER — Other Ambulatory Visit: Payer: Self-pay | Admitting: Obstetrics and Gynecology

## 2019-11-27 DIAGNOSIS — N6489 Other specified disorders of breast: Secondary | ICD-10-CM

## 2019-12-20 ENCOUNTER — Other Ambulatory Visit: Payer: 59

## 2019-12-24 ENCOUNTER — Other Ambulatory Visit: Payer: Self-pay

## 2019-12-24 ENCOUNTER — Other Ambulatory Visit: Payer: Self-pay | Admitting: Obstetrics and Gynecology

## 2019-12-24 ENCOUNTER — Ambulatory Visit
Admission: RE | Admit: 2019-12-24 | Discharge: 2019-12-24 | Disposition: A | Discharge status: Home / Self Care | Payer: 59 | Source: Ambulatory Visit | Attending: Obstetrics and Gynecology | Admitting: Obstetrics and Gynecology

## 2019-12-24 ENCOUNTER — Ambulatory Visit: Admission: RE | Admit: 2019-12-24 | Payer: 59 | Source: Ambulatory Visit

## 2019-12-24 DIAGNOSIS — N6489 Other specified disorders of breast: Secondary | ICD-10-CM

## 2020-01-26 ENCOUNTER — Emergency Department (HOSPITAL_COMMUNITY): Payer: 59

## 2020-01-26 ENCOUNTER — Other Ambulatory Visit: Payer: Self-pay

## 2020-01-26 ENCOUNTER — Emergency Department (HOSPITAL_COMMUNITY)
Admission: EM | Admit: 2020-01-26 | Discharge: 2020-01-26 | Disposition: A | Discharge status: Home / Self Care | Payer: 59 | Attending: Emergency Medicine | Admitting: Emergency Medicine

## 2020-01-26 DIAGNOSIS — R509 Fever, unspecified: Secondary | ICD-10-CM | POA: Diagnosis not present

## 2020-01-26 DIAGNOSIS — Z5321 Procedure and treatment not carried out due to patient leaving prior to being seen by health care provider: Secondary | ICD-10-CM | POA: Diagnosis not present

## 2020-01-26 DIAGNOSIS — R110 Nausea: Secondary | ICD-10-CM | POA: Insufficient documentation

## 2020-01-26 LAB — CBC WITH DIFFERENTIAL/PLATELET
Abs Immature Granulocytes: 0.04 10*3/uL (ref 0.00–0.07)
Basophils Absolute: 0 10*3/uL (ref 0.0–0.1)
Basophils Relative: 0 %
Eosinophils Absolute: 0 10*3/uL (ref 0.0–0.5)
Eosinophils Relative: 0 %
HCT: 48.2 % — ABNORMAL HIGH (ref 36.0–46.0)
Hemoglobin: 16 g/dL — ABNORMAL HIGH (ref 12.0–15.0)
Immature Granulocytes: 0 %
Lymphocytes Relative: 11 %
Lymphs Abs: 1 10*3/uL (ref 0.7–4.0)
MCH: 30.8 pg (ref 26.0–34.0)
MCHC: 33.2 g/dL (ref 30.0–36.0)
MCV: 92.7 fL (ref 80.0–100.0)
Monocytes Absolute: 0.5 10*3/uL (ref 0.1–1.0)
Monocytes Relative: 5 %
Neutro Abs: 8.2 10*3/uL — ABNORMAL HIGH (ref 1.7–7.7)
Neutrophils Relative %: 84 %
Platelets: 230 10*3/uL (ref 150–400)
RBC: 5.2 MIL/uL — ABNORMAL HIGH (ref 3.87–5.11)
RDW: 12.4 % (ref 11.5–15.5)
WBC: 9.7 10*3/uL (ref 4.0–10.5)
nRBC: 0 % (ref 0.0–0.2)

## 2020-01-26 LAB — COMPREHENSIVE METABOLIC PANEL
ALT: 49 U/L — ABNORMAL HIGH (ref 0–44)
AST: 28 U/L (ref 15–41)
Albumin: 4.4 g/dL (ref 3.5–5.0)
Alkaline Phosphatase: 73 U/L (ref 38–126)
Anion gap: 12 (ref 5–15)
BUN: 26 mg/dL — ABNORMAL HIGH (ref 6–20)
CO2: 26 mmol/L (ref 22–32)
Calcium: 9.5 mg/dL (ref 8.9–10.3)
Chloride: 100 mmol/L (ref 98–111)
Creatinine, Ser: 1.11 mg/dL — ABNORMAL HIGH (ref 0.44–1.00)
GFR calc Af Amer: >60 mL/min (ref >60)
GFR calc non Af Amer: 55 mL/min — ABNORMAL LOW (ref >60)
Glucose, Bld: 136 mg/dL — ABNORMAL HIGH (ref 70–99)
Potassium: 3.9 mmol/L (ref 3.5–5.1)
Sodium: 138 mmol/L (ref 135–145)
Total Bilirubin: 1 mg/dL (ref 0.3–1.2)
Total Protein: 7.5 g/dL (ref 6.5–8.1)

## 2020-01-26 LAB — URINALYSIS, ROUTINE W REFLEX MICROSCOPIC
Bacteria, UA: NONE SEEN
Bilirubin Urine: NEGATIVE
Glucose, UA: NEGATIVE mg/dL
Ketones, ur: 5 mg/dL — AB
Nitrite: NEGATIVE
Protein, ur: 100 mg/dL — AB
Specific Gravity, Urine: 1.027 (ref 1.005–1.030)
pH: 5 (ref 5.0–8.0)

## 2020-01-26 LAB — LACTIC ACID, PLASMA: Lactic Acid, Venous: 1.5 mmol/L (ref 0.5–1.9)

## 2020-01-26 MED ORDER — SODIUM CHLORIDE 0.9% FLUSH: 3.0000 mL | Freq: Once | INTRAVENOUS | Status: DC

## 2020-01-26 NOTE — ED Notes (Signed)
Pt stated she just no longer wanting to wait and that she was leaving.

## 2020-01-26 NOTE — ED Triage Notes (Signed)
Onset yesterday nausea, felt ok after eating eggs and toast.  Few hours later body aches, vomiting, last vomit this morning 8am.  Today started with fever and cough.  Called doctor this morning to get Zofran, took Zofran, no nausea at this time.

## 2020-03-11 ENCOUNTER — Other Ambulatory Visit: Payer: Self-pay

## 2020-03-11 ENCOUNTER — Emergency Department (HOSPITAL_BASED_OUTPATIENT_CLINIC_OR_DEPARTMENT_OTHER): Payer: 59

## 2020-03-11 ENCOUNTER — Emergency Department (HOSPITAL_BASED_OUTPATIENT_CLINIC_OR_DEPARTMENT_OTHER)
Admission: EM | Admit: 2020-03-11 | Discharge: 2020-03-11 | Disposition: A | Discharge status: Home / Self Care | Payer: 59 | Attending: Emergency Medicine | Admitting: Emergency Medicine

## 2020-03-11 ENCOUNTER — Encounter (HOSPITAL_BASED_OUTPATIENT_CLINIC_OR_DEPARTMENT_OTHER): Payer: Self-pay | Admitting: *Deleted

## 2020-03-11 DIAGNOSIS — Z79899 Other long term (current) drug therapy: Secondary | ICD-10-CM | POA: Insufficient documentation

## 2020-03-11 DIAGNOSIS — R079 Chest pain, unspecified: Secondary | ICD-10-CM | POA: Diagnosis not present

## 2020-03-11 DIAGNOSIS — R0602 Shortness of breath: Secondary | ICD-10-CM | POA: Diagnosis present

## 2020-03-11 DIAGNOSIS — I10 Essential (primary) hypertension: Secondary | ICD-10-CM | POA: Insufficient documentation

## 2020-03-11 DIAGNOSIS — R42 Dizziness and giddiness: Secondary | ICD-10-CM | POA: Insufficient documentation

## 2020-03-11 DIAGNOSIS — Z8522 Personal history of malignant neoplasm of nasal cavities, middle ear, and accessory sinuses: Secondary | ICD-10-CM | POA: Diagnosis not present

## 2020-03-11 DIAGNOSIS — R000 Tachycardia, unspecified: Secondary | ICD-10-CM

## 2020-03-11 LAB — CBC WITH DIFFERENTIAL/PLATELET
Abs Immature Granulocytes: 0.09 10*3/uL — ABNORMAL HIGH (ref 0.00–0.07)
Basophils Absolute: 0.1 10*3/uL (ref 0.0–0.1)
Basophils Relative: 1 %
Eosinophils Absolute: 0.3 10*3/uL (ref 0.0–0.5)
Eosinophils Relative: 3 %
HCT: 44.6 % (ref 36.0–46.0)
Hemoglobin: 15.5 g/dL — ABNORMAL HIGH (ref 12.0–15.0)
Immature Granulocytes: 1 %
Lymphocytes Relative: 32 %
Lymphs Abs: 3.4 10*3/uL (ref 0.7–4.0)
MCH: 31.3 pg (ref 26.0–34.0)
MCHC: 34.8 g/dL (ref 30.0–36.0)
MCV: 89.9 fL (ref 80.0–100.0)
Monocytes Absolute: 0.6 10*3/uL (ref 0.1–1.0)
Monocytes Relative: 5 %
Neutro Abs: 6.2 10*3/uL (ref 1.7–7.7)
Neutrophils Relative %: 58 %
Platelets: 246 10*3/uL (ref 150–400)
RBC: 4.96 MIL/uL (ref 3.87–5.11)
RDW: 12.1 % (ref 11.5–15.5)
WBC: 10.7 10*3/uL — ABNORMAL HIGH (ref 4.0–10.5)
nRBC: 0 % (ref 0.0–0.2)

## 2020-03-11 LAB — COMPREHENSIVE METABOLIC PANEL
ALT: 54 U/L — ABNORMAL HIGH (ref 0–44)
AST: 31 U/L (ref 15–41)
Albumin: 4.8 g/dL (ref 3.5–5.0)
Alkaline Phosphatase: 84 U/L (ref 38–126)
Anion gap: 13 (ref 5–15)
BUN: 17 mg/dL (ref 6–20)
CO2: 27 mmol/L (ref 22–32)
Calcium: 9.6 mg/dL (ref 8.9–10.3)
Chloride: 99 mmol/L (ref 98–111)
Creatinine, Ser: 0.83 mg/dL (ref 0.44–1.00)
GFR calc Af Amer: >60 mL/min (ref >60)
GFR calc non Af Amer: >60 mL/min (ref >60)
Glucose, Bld: 123 mg/dL — ABNORMAL HIGH (ref 70–99)
Potassium: 3.7 mmol/L (ref 3.5–5.1)
Sodium: 139 mmol/L (ref 135–145)
Total Bilirubin: 0.6 mg/dL (ref 0.3–1.2)
Total Protein: 8 g/dL (ref 6.5–8.1)

## 2020-03-11 LAB — LIPASE, BLOOD: Lipase: 29 U/L (ref 11–51)

## 2020-03-11 LAB — TROPONIN I (HIGH SENSITIVITY): Troponin I (High Sensitivity): 3 ng/L (ref <18)

## 2020-03-11 LAB — D-DIMER, QUANTITATIVE: D-Dimer, Quant: 0.29 ug/mL-FEU (ref 0.00–0.50)

## 2020-03-11 MED ORDER — SODIUM CHLORIDE 0.9 % IV BOLUS
500.0000 mL | Freq: Once | INTRAVENOUS | Status: AC
  Administered 2020-03-11: 500 mL via INTRAVENOUS

## 2020-03-11 MED ORDER — SODIUM CHLORIDE 0.9 % IV BOLUS: 1000.0000 mL | Freq: Once | INTRAVENOUS | Status: DC

## 2020-03-11 NOTE — ED Triage Notes (Signed)
co left sided chest pain with SOB and dizziness x 1 week

## 2020-03-11 NOTE — ED Provider Notes (Signed)
[facility] EMERGENCY DEPARTMENT Provider Note   CSN: [id number] Arrival date & time: 03/11/20  1408     History Chief Complaint  Patient presents with  . Chest Pain    Judy Jones is a 54 y.o. female.  The history is provided by the patient.  Chest Pain Pain location:  Substernal area Pain quality: aching   Pain radiates to:  Does not radiate Pain severity:  Mild Onset quality:  Gradual Timing:  Intermittent Progression:  Waxing and waning Chronicity:  New Context: at rest   Relieved by:  Nothing Worsened by:  Nothing Associated symptoms: shortness of breath   Associated symptoms: no abdominal pain, no back pain, no cough, no fever, no palpitations and no vomiting   Risk factors: hypertension   Risk factors: no coronary artery disease, no diabetes mellitus, no high cholesterol and no prior DVT/PE        Past Medical History:  Diagnosis Date  . Anxiety   . Arthritis   . Cancer ([facility]) 07/2018   Basal cell--nose  . Complication of anesthesia   . Elevated hemoglobin A1c 2018  . Family history of anesthesia complication    Mother get sick on her stomach  . GERD (gastroesophageal reflux disease)   . Hypertension   . IBS (irritable bowel syndrome)   . PONV (postoperative nausea and vomiting)   . Wears glasses     Patient Active Problem List   Diagnosis Date Noted  . IBS (irritable bowel syndrome)   . Genuine stress incontinence, female 04/23/2014  . ALLERGIC RHINITIS 05/30/2008  . COUGH 05/30/2008    Past Surgical History:  Procedure Laterality Date  . CARPAL TUNNEL RELEASE Left 10/24/2014   Procedure: LEFT CARPAL TUNNEL ;  Surgeon: Roseanne Kaufman, MD;  Location: [facility];  Service: Orthopedics;  Laterality: Left;  . CESAREAN SECTION  1985  . CHOLECYSTECTOMY  1991  . COLONOSCOPY     Hx: of  . EYE SURGERY  2013   both cataracts  . MOHS SURGERY  07/2018   nose--basal cell  . ULNAR NERVE TRANSPOSITION Right 08/01/2013     Procedure: RIGHT LIMITED CARPAL TUNNEL RELEASE,RIGHT CUBITAL TUNNEL RELEASE; right cubital tunnel release INSITU ,EXCISION RIGHT MIDDLE FINGER MASS;  Surgeon: Roseanne Kaufman, MD;  Location: [facility];  Service: Orthopedics;  Laterality: Right;  . ULNAR NERVE TRANSPOSITION Left 10/24/2014   Procedure: ULNAR NERVE DECOMPRESSION AT ELBOW;  Surgeon: Roseanne Kaufman, MD;  Location: [facility];  Service: Orthopedics;  Laterality: Left;  Marland Kitchen VAGINAL HYSTERECTOMY  2007   ant/post repair  . WISDOM TOOTH EXTRACTION       OB History    Gravida  2   Para  2   Term  2   Preterm      AB      Living  2     SAB      TAB      Ectopic      Multiple      Live Births              Family History  Problem Relation Age of Onset  . Hypertension Mother   . Atrial fibrillation Mother   . Alzheimer's disease Father   . Bipolar disorder Other     Social History   Tobacco Use  . Smoking status: Never Smoker  . Smokeless tobacco: Never Used  Vaping Use  . Vaping Use: Never used  Substance Use Topics  . Alcohol  use: Not Currently    Comment: occ  . Drug use: No    Home Medications Prior to Admission medications   Medication Sig Start Date End Date Taking? Authorizing Provider  ALPRAZolam Duanne Moron) 0.25 MG tablet Take 0.25 mg by mouth daily as needed for anxiety.    Yes [provider]  betamethasone valerate ointment (VALISONE) 0.1 % Apply a pea sized amount to skin 2 x a day for 1-2 weeks as needed 05/25/18  Yes Nunzio Cobbs, MD  Cholecalciferol (VITAMIN D) 2000 UNITS CAPS Take 2,000 Units by mouth daily.   Yes [provider]  esomeprazole (NEXIUM) 40 MG capsule Take 40 mg by mouth every evening.   Yes [provider]  loratadine (CLARITIN) 10 MG tablet Take 10 mg by mouth daily.   Yes [provider]  losartan-hydrochlorothiazide (HYZAAR) 100-12.5 MG per tablet Take 1 tablet by mouth daily.  04/08/15  Yes [provider]  Magnesium 250 MG TABS Take 250 mg by mouth at bedtime.   Yes [provider]  OVER THE COUNTER MEDICATION Take 5 mLs by mouth daily. "Leefy" liquid supplement with ginger, turmeric and pepper   Yes [provider]    Allergies    Shellfish allergy, Flagyl [metronidazole], Morphine, Penicillins, and Percocet [oxycodone-acetaminophen]  Review of Systems   Review of Systems  Constitutional: Negative for chills and fever.  HENT: Negative for ear pain and sore throat.   Eyes: Negative for pain and visual disturbance.  Respiratory: Positive for shortness of breath. Negative for cough.   Cardiovascular: Positive for chest pain. Negative for palpitations.  Gastrointestinal: Negative for abdominal pain and vomiting.  Genitourinary: Negative for dysuria and hematuria.  Musculoskeletal: Negative for arthralgias and back pain.  Skin: Negative for color change and rash.  Neurological: Negative for seizures and syncope.  All other systems reviewed and are negative.   Physical Exam Updated Vital Signs  ED Triage Vitals  Enc Vitals Group     BP 03/11/20 1418 (!) 173/79     Pulse Rate 03/11/20 1418 (!) 121     Resp 03/11/20 1418 18     Temp 03/11/20 1418 98.6 F (37 C)     Temp Source 03/11/20 1418 Oral     SpO2 03/11/20 1418 100 %     Weight 03/11/20 1415 190 lb (86.2 kg)     Height 03/11/20 1415 5\' 5"  (1.651 m)     Head Circumference --      Peak Flow --      Pain Score 03/11/20 1415 4     Pain Loc --      Pain Edu? --      Excl. in [place]? --     Physical Exam Vitals and nursing note reviewed.  Constitutional:      General: She is not in acute distress.    Appearance: She is well-developed. She is not ill-appearing.  HENT:     Head: Normocephalic and atraumatic.  Eyes:     Conjunctiva/sclera: Conjunctivae normal.     Pupils: Pupils are equal, round, and reactive to light.  Cardiovascular:     Rate and Rhythm: Regular rhythm. Tachycardia present.      Pulses:          Radial pulses are 2+ on the right side and 2+ on the left side.     Heart sounds: Normal heart sounds. No murmur heard.   Pulmonary:     Effort: Pulmonary effort is normal.  No respiratory distress.     Breath sounds: Normal breath sounds. No decreased breath sounds, wheezing or rhonchi.  Abdominal:     Palpations: Abdomen is soft.     Tenderness: There is no abdominal tenderness.  Musculoskeletal:        General: Normal range of motion.     Cervical back: Neck supple.     Right lower leg: No edema.     Left lower leg: No edema.  Skin:    General: Skin is warm and dry.     Capillary Refill: Capillary refill takes less than 2 seconds.  Neurological:     General: No focal deficit present.     Mental Status: She is alert.     ED Results / Procedures / Treatments   Labs (all labs ordered are listed, but only abnormal results are displayed) Labs Reviewed  CBC WITH DIFFERENTIAL/PLATELET - Abnormal; Notable for the following components:      Result Value   WBC 10.7 (*)    Hemoglobin 15.5 (*)    Abs Immature Granulocytes 0.09 (*)    All other components within normal limits  COMPREHENSIVE METABOLIC PANEL  LIPASE, BLOOD  D-DIMER, QUANTITATIVE (NOT AT [facility])  TROPONIN I (HIGH SENSITIVITY)    EKG EKG Interpretation  Date/Time:  Wednesday March 11 2020 14:18:16 EDT Ventricular Rate:  125 PR Interval:    QRS Duration: 133 QT Interval:  306 QTC Calculation: 442 R Axis:   55 Text Interpretation: Sinus tachycardia Nonspecific intraventricular conduction delay Confirmed by Lennice Sites [phone]) on 03/11/2020 2:26:12 PM   Radiology No results found.  Procedures Procedures (including critical care time)  Medications Ordered in ED Medications  sodium chloride 0.9 % bolus 500 mL (500 mLs Intravenous New Bag/Given 03/11/20 1428)    ED Course  I have reviewed the triage vital signs and the nursing notes.  Pertinent labs & imaging results that were  available during my care of the patient were reviewed by me and considered in my medical decision making (see chart for details).    MDM Rules/Calculators/A&P                          Triniti Seth Friedlander is a 54 year old female who presents to the ED with chest pain, shortness of breath.  Patient with overall unremarkable vitals except for mild hypertension.  Mild tachycardia.  Patient with history of anxiety, reflux, high blood pressure.  No cardiac or pulmonary history.  Not a smoker.  Has had some on and off chest discomfort possibly reflux related but sometimes feels different.  She has felt a little bit short of breath at times.  Some dizziness as well.  Has been under a lot of stress as well.  Patient overall appears well.  Mildly anxious.  Clear breath sounds.  No signs of volume overload on exam.  Overall EKG shows sinus tachycardia.  No ischemic changes.  Story is atypical for ACS.  No PE risk factors but given tachycardia and shortness of breath will get D-dimer.  Will check a troponin.  Will check basic labs.  Denies any bleeding.  No significant anemia, leukocytosis.  Patient with tachycardia that has now improved.  Patient signed out to oncoming ED staff with patient pending remaining labs and work-up.  Please see their note for further results, evaluation, disposition of the patient.  This chart was dictated using voice recognition software.  Despite best efforts to proofread,  errors can occur  which can change the documentation meaning.    Final Clinical Impression(s) / ED Diagnoses Final diagnoses:  Chest pain, unspecified type    Rx / DC Orders ED Discharge Orders    None       Lennice Sites, DO 03/11/20 1451

## 2020-03-11 NOTE — ED Notes (Signed)
Patient stated that she is not in pain when she is still.  If she starts moving, she felt tightness on her chest when she breathes.

## 2020-03-11 NOTE — ED Provider Notes (Signed)
  Physical Exam  BP (!) 145/75   Pulse (!) 110   Temp 98.6 F (37 C) (Oral)   Resp 19   Ht 5\' 5"  (1.651 m)   Wt 86.2 kg   LMP 01/13/2006   SpO2 100%   BMI 31.62 kg/m   Physical Exam  ED Course/Procedures     Procedures  MDM  Patient here with chest pain and subjective shortness of breath.  Patient's heart rate is in the 110s to 120s but there appears very anxious.  When she is calm, her heart rate dipped down to low 100s.  Signout pending D-dimer and troponin.  Since symptoms going on for 3 days, 1 set of troponin was sufficient.  5:04 PM Rate down to 101.  Troponin negative x1, D-dimer normal.  Chest x-ray is normal.  Since she has resting tachycardia, will refer to cardiology for follow-up.       Drenda Freeze, MD 03/11/20 [phone]

## 2020-03-11 NOTE — ED Notes (Signed)
Patient transported to X-ray 

## 2020-03-11 NOTE — Discharge Instructions (Signed)
Continue current meds.  Your heart rate is elevated so please see your heart doctor.  Return to ER if you have worse chest pain, palpitations

## 2020-03-12 ENCOUNTER — Ambulatory Visit (INDEPENDENT_AMBULATORY_CARE_PROVIDER_SITE_OTHER): Payer: 59 | Admitting: Psychology

## 2020-03-12 DIAGNOSIS — F4323 Adjustment disorder with mixed anxiety and depressed mood: Secondary | ICD-10-CM | POA: Diagnosis not present

## 2020-03-26 ENCOUNTER — Ambulatory Visit (INDEPENDENT_AMBULATORY_CARE_PROVIDER_SITE_OTHER): Payer: 59 | Admitting: Psychology

## 2020-03-26 DIAGNOSIS — F4323 Adjustment disorder with mixed anxiety and depressed mood: Secondary | ICD-10-CM | POA: Diagnosis not present

## 2020-04-06 ENCOUNTER — Other Ambulatory Visit: Payer: Self-pay

## 2020-04-06 ENCOUNTER — Encounter: Payer: Self-pay | Admitting: Cardiology

## 2020-04-06 ENCOUNTER — Ambulatory Visit (INDEPENDENT_AMBULATORY_CARE_PROVIDER_SITE_OTHER): Payer: 59 | Admitting: Cardiology

## 2020-04-06 DIAGNOSIS — R072 Precordial pain: Secondary | ICD-10-CM | POA: Diagnosis not present

## 2020-04-06 DIAGNOSIS — E119 Type 2 diabetes mellitus without complications: Secondary | ICD-10-CM

## 2020-04-06 DIAGNOSIS — E663 Overweight: Secondary | ICD-10-CM | POA: Insufficient documentation

## 2020-04-06 DIAGNOSIS — R0789 Other chest pain: Secondary | ICD-10-CM

## 2020-04-06 DIAGNOSIS — I10 Essential (primary) hypertension: Secondary | ICD-10-CM

## 2020-04-06 HISTORY — DX: Other chest pain: R07.89

## 2020-04-06 HISTORY — DX: Overweight: E66.3

## 2020-04-06 HISTORY — DX: Essential (primary) hypertension: I10

## 2020-04-06 HISTORY — DX: Type 2 diabetes mellitus without complications: E11.9

## 2020-04-06 MED ORDER — NITROGLYCERIN 0.4 MG SL SUBL: 0.4000 mg | SUBLINGUAL_TABLET | SUBLINGUAL | 6 refills | Status: DC | PRN

## 2020-04-06 MED FILL — NITROGLYCERIN 0.4 MG TAB SL: 0.4 | 7 days supply | Qty: 25 | Fill #0

## 2020-04-06 NOTE — Progress Notes (Signed)
Cardiology Office Note:    Date:  04/06/2020   ID:  Judy Jones, DOB [birth year], MRN [id number]  PCP:  Antony Contras, MD  Cardiologist:  Jenean Lindau, MD   Referring MD: Antony Contras, MD    ASSESSMENT:    1. Chest tightness   2. Essential hypertension   3. Diet-controlled diabetes mellitus ([place])   4. Overweight    PLAN:    In order of problems listed above:  1. Primary prevention stressed with the patient.  Importance of compliance with diet and medication stressed and she vocalized understanding.  I reviewed emergency room records extensively including lab work and EKG. 2. Chest tightness: I discussed these findings with her at extensive length.  There are atypical for coronary etiology but patient has multiple risk factors for coronary artery disease and therefore we will do a Lexiscan sestamibi.  I will also check her liver lipid today. 3. Diet-controlled diabetes mellitus: Diet was emphasized.  Weight reduction was stressed risks of being overweight emphasized and sedentary lifestyle issues were discussed and she promises to do better. 4. Mixed dyslipidemia: I will check lipid blood work today and diet was emphasized extensively to her.  She promises to do better. 5. Sublingual nitroglycerin prescription was sent, its protocol and 911 protocol explained and the patient vocalized understanding questions were answered to the patient's satisfaction 6. Patient will be seen in follow-up appointment in 2 months or earlier if the patient has any concerns   Medication Adjustments/Labs and Tests Ordered: Current medicines are reviewed at length with the patient today.  Concerns regarding medicines are outlined above.  No orders of the defined types were placed in this encounter.  No orders of the defined types were placed in this encounter.    History of Present Illness:    Judy Jones is a 54 y.o. female who is being seen today for the evaluation of chest  tightness at the request of Antony Contras, MD.  Patient is a pleasant 54 year old female.  She has past medical history of essential hypertension, diet controlled diabetes mellitus dyslipidemia and she is overweight and leads a sedentary lifestyle.  She mentioned to me that she went to the hospital once with chest tightness with no radiation to the neck or to the arms.  No orthopnea or PND.  She does not exercise on a regular basis and she was not under any significant stress at that time.  At the time of my evaluation, the patient is alert awake oriented and in no distress.  Patient mentions to me that sexual activity does not bring around the symptoms.  Past Medical History:  Diagnosis Date  . Anxiety   . Arthritis   . Cancer ([place]) 07/2018   Basal cell--nose  . Complication of anesthesia   . Elevated hemoglobin A1c 2018  . Family history of anesthesia complication    Mother get sick on her stomach  . GERD (gastroesophageal reflux disease)   . Hypertension   . IBS (irritable bowel syndrome)   . PONV (postoperative nausea and vomiting)   . Wears glasses     Past Surgical History:  Procedure Laterality Date  . CARPAL TUNNEL RELEASE Left 10/24/2014   Procedure: LEFT CARPAL TUNNEL ;  Surgeon: Roseanne Kaufman, MD;  Location: [facility];  Service: Orthopedics;  Laterality: Left;  . CESAREAN SECTION  1985  . CHOLECYSTECTOMY  1991  . COLONOSCOPY     Hx: of  . EYE SURGERY  2013  both cataracts  . MOHS SURGERY  07/2018   nose--basal cell  . ULNAR NERVE TRANSPOSITION Right 08/01/2013   Procedure: RIGHT LIMITED CARPAL TUNNEL RELEASE,RIGHT CUBITAL TUNNEL RELEASE; right cubital tunnel release INSITU ,EXCISION RIGHT MIDDLE FINGER MASS;  Surgeon: Roseanne Kaufman, MD;  Location: [facility];  Service: Orthopedics;  Laterality: Right;  . ULNAR NERVE TRANSPOSITION Left 10/24/2014   Procedure: ULNAR NERVE DECOMPRESSION AT ELBOW;  Surgeon: Roseanne Kaufman, MD;  Location: [facility];   Service: Orthopedics;  Laterality: Left;  Marland Kitchen VAGINAL HYSTERECTOMY  2007   ant/post repair  . WISDOM TOOTH EXTRACTION      Current Medications: Current Meds  Medication Sig  . ALPRAZolam (XANAX) 0.25 MG tablet Take 0.25 mg by mouth daily as needed for anxiety.   . betamethasone valerate ointment (VALISONE) 0.1 % Apply a pea sized amount to skin 2 x a day for 1-2 weeks as needed  . Cholecalciferol (VITAMIN D) 2000 UNITS CAPS Take 2,000 Units by mouth daily.  Marland Kitchen esomeprazole (NEXIUM) 40 MG capsule Take 40 mg by mouth every evening.  . fluticasone (FLONASE) 50 MCG/ACT nasal spray Place 1 spray into both nostrils 2 (two) times daily.  Marland Kitchen loratadine (CLARITIN) 10 MG tablet Take 10 mg by mouth daily.  Marland Kitchen losartan-hydrochlorothiazide (HYZAAR) 100-12.5 MG per tablet Take 1 tablet by mouth daily.   . Magnesium 250 MG TABS Take 250 mg by mouth at bedtime.  Marland Kitchen OVER THE COUNTER MEDICATION Take 5 mLs by mouth daily. "Leefy" liquid supplement with ginger, turmeric and pepper     Allergies:   Shellfish allergy, Flagyl [metronidazole], Morphine, Other, Penicillins, and Percocet [oxycodone-acetaminophen]   Social History   Socioeconomic History  . Marital status: Married    Spouse name: Not on file  . Number of children: Not on file  . Years of education: Not on file  . Highest education level: Not on file  Occupational History  . Not on file  Tobacco Use  . Smoking status: Never Smoker  . Smokeless tobacco: Never Used  Vaping Use  . Vaping Use: Never used  Substance and Sexual Activity  . Alcohol use: Not Currently    Comment: occ  . Drug use: No  . Sexual activity: Yes    Partners: Male    Birth control/protection: Surgical    Comment: TVH--ovaries remain  Other Topics Concern  . Not on file  Social History Narrative  . Not on file   Social Determinants of Health   Financial Resource Strain:   . Difficulty of Paying Living Expenses: Not on file  Food Insecurity:   . Worried About  Charity fundraiser in the Last Year: Not on file  . Ran Out of Food in the Last Year: Not on file  Transportation Needs:   . Lack of Transportation (Medical): Not on file  . Lack of Transportation (Non-Medical): Not on file  Physical Activity:   . Days of Exercise per Week: Not on file  . Minutes of Exercise per Session: Not on file  Stress:   . Feeling of Stress : Not on file  Social Connections:   . Frequency of Communication with Friends and Family: Not on file  . Frequency of Social Gatherings with Friends and Family: Not on file  . Attends Religious Services: Not on file  . Active Member of Clubs or Organizations: Not on file  . Attends Archivist Meetings: Not on file  . Marital Status: Not on file  Family History: The patient's family history includes Alzheimer's disease in her father; Atrial fibrillation in her mother; Bipolar disorder in an other family member; Hypertension in her mother.  ROS:   Please see the history of present illness.    All other systems reviewed and are negative.  EKGs/Labs/Other Studies Reviewed:    The following studies were reviewed today: EKG done today reveals sinus rhythm and nonspecific ST-T changes   Recent Labs: 03/11/2020: ALT 54; BUN 17; Creatinine, Ser 0.83; Hemoglobin 15.5; Platelets 246; Potassium 3.7; Sodium 139  Recent Lipid Panel No results found for: CHOL, TRIG, HDL, CHOLHDL, VLDL, LDLCALC, LDLDIRECT  Physical Exam:    VS:  BP 124/80   Pulse 81   Ht 5\' 5"  (1.651 m)   Wt 190 lb 1.9 oz (86.2 kg)   LMP 01/13/2006   SpO2 100%   BMI 31.64 kg/m     Wt Readings from Last 3 Encounters:  04/06/20 190 lb 1.9 oz (86.2 kg)  03/11/20 190 lb (86.2 kg)  01/26/20 195 lb (88.5 kg)     GEN: Patient is in no acute distress HEENT: Normal NECK: No JVD; No carotid bruits LYMPHATICS: No lymphadenopathy CARDIAC: S1 S2 regular, 2/6 systolic murmur at the apex. RESPIRATORY:  Clear to auscultation without rales, wheezing  or rhonchi  ABDOMEN: Soft, non-tender, non-distended MUSCULOSKELETAL:  No edema; No deformity  SKIN: Warm and dry NEUROLOGIC:  Alert and oriented x 3 PSYCHIATRIC:  Normal affect    Signed, Jenean Lindau, MD  04/06/2020 9:16 AM    [place]

## 2020-04-06 NOTE — Patient Instructions (Signed)
Medication Instructions:  Your physician has recommended you make the following change in your medication:   Take Nitroglycerin as needed for chest pain.  *If you need a refill on your cardiac medications before your next appointment, please call your pharmacy*   Lab Work: Your physician recommends that you have labs done in the office today. Your test included TSH, liver function and lipids.  If you have labs (blood work) drawn today and your tests are completely normal, you will receive your results only by: Marland Kitchen MyChart Message (if you have MyChart) OR . A paper copy in the mail If you have any lab test that is abnormal or we need to change your treatment, we will call you to review the results.   Testing/Procedures: Your physician has requested that you have a lexiscan myoview. For further information please visit [email or web address]. Please follow instruction sheet, as given.  The test will take approximately 3 to 4 hours to complete; you may bring reading material.  If someone comes with you to your appointment, they will need to remain in the main lobby due to limited space in the testing area.   How to prepare for your Myocardial Perfusion Test: . Do not eat or drink 3 hours prior to your test, except you may have water. . Do not consume products containing caffeine (regular or decaffeinated) 12 hours prior to your test. (ex: coffee, chocolate, sodas, tea). . Do bring a list of your current medications with you.  If not listed below, you may take your medications as normal. . Do wear comfortable clothes (no dresses or overalls) and walking shoes, tennis shoes preferred (No heels or open toe shoes are allowed). . Do NOT wear cologne, perfume, aftershave, or lotions (deodorant is allowed). . If these instructions are not followed, your test will have to be rescheduled.   Follow-Up: At [facility], you and your health needs are our priority.  As part of our continuing mission to  provide you with exceptional heart care, we have created designated Provider Care Teams.  These Care Teams include your primary Cardiologist (physician) and Advanced Practice Providers (APPs -  Physician Assistants and Nurse Practitioners) who all work together to provide you with the care you need, when you need it.  We recommend signing up for the patient portal called "MyChart".  Sign up information is provided on this After Visit Summary.  MyChart is used to connect with patients for Virtual Visits (Telemedicine).  Patients are able to view lab/test results, encounter notes, upcoming appointments, etc.  Non-urgent messages can be sent to your provider as well.   To learn more about what you can do with MyChart, go to [email or web address].    Your next appointment:   2 month(s)  The format for your next appointment:   In Person  Provider:   Jyl Heinz, MD   Other Instructions  Cardiac Nuclear Scan A cardiac nuclear scan is a test that is done to check the flow of blood to your heart. It is done when you are resting and when you are exercising. The test looks for problems such as:  Not enough blood reaching a portion of the heart.  The heart muscle not working as it should. You may need this test if:  You have heart disease.  You have had lab results that are not normal.  You have had heart surgery or a balloon procedure to open up blocked arteries (angioplasty).  You have chest pain.  You have shortness  of breath. In this test, a special dye (tracer) is put into your bloodstream. The tracer will travel to your heart. A camera will then take pictures of your heart to see how the tracer moves through your heart. This test is usually done at a hospital and takes 2-4 hours. Tell a doctor about:  Any allergies you have.  All medicines you are taking, including vitamins, herbs, eye drops, creams, and over-the-counter medicines.  Any problems you or family members have had  with anesthetic medicines.  Any blood disorders you have.  Any surgeries you have had.  Any medical conditions you have.  Whether you are pregnant or may be pregnant. What are the risks? Generally, this is a safe test. However, problems may occur, such as:  Serious chest pain and heart attack. This is only a risk if the stress portion of the test is done.  Rapid heartbeat.  A feeling of warmth in your chest. This feeling usually does not last long.  Allergic reaction to the tracer. What happens before the test?  Ask your doctor about changing or stopping your normal medicines. This is important.  Follow instructions from your doctor about what you cannot eat or drink.  Remove your jewelry on the day of the test. What happens during the test?  An IV tube will be inserted into one of your veins.  Your doctor will give you a small amount of tracer through the IV tube.  You will wait for 20-40 minutes while the tracer moves through your bloodstream.  Your heart will be monitored with an electrocardiogram (ECG).  You will lie down on an exam table.  Pictures of your heart will be taken for about 15-20 minutes.  You may also have a stress test. For this test, one of these things may be done: ? You will be asked to exercise on a treadmill or a stationary bike. ? You will be given medicines that will make your heart work harder. This is done if you are unable to exercise.  When blood flow to your heart has peaked, a tracer will again be given through the IV tube.  After 20-40 minutes, you will get back on the exam table. More pictures will be taken of your heart.  Depending on the tracer that is used, more pictures may need to be taken 3-4 hours later.  Your IV tube will be removed when the test is over. The test may vary among doctors and hospitals. What happens after the test?  Ask your doctor: ? Whether you can return to your normal schedule, including diet,  activities, and medicines. ? Whether you should drink more fluids. This will help to remove the tracer from your body. Drink enough fluid to keep your pee (urine) pale yellow.  Ask your doctor, or the department that is doing the test: ? When will my results be ready? ? How will I get my results? Summary  A cardiac nuclear scan is a test that is done to check the flow of blood to your heart.  Tell your doctor whether you are pregnant or may be pregnant.  Before the test, ask your doctor about changing or stopping your normal medicines. This is important.  Ask your doctor whether you can return to your normal activities. You may be asked to drink more fluids. This information is not intended to replace advice given to you by your health care provider. Make sure you discuss any questions you have with your health  care provider. Document Revised: 11/21/2018 Document Reviewed: 01/15/2018 Elsevier Patient Education  [place].   Nitroglycerin sublingual tablets What is this medicine? NITROGLYCERIN (nye troe GLI ser in) is a type of vasodilator. It relaxes blood vessels, increasing the blood and oxygen supply to your heart. This medicine is used to relieve chest pain caused by angina. It is also used to prevent chest pain before activities like climbing stairs, going outdoors in cold weather, or sexual activity. This medicine may be used for other purposes; ask your health care provider or pharmacist if you have questions. COMMON BRAND NAME(S): Nitroquick, Nitrostat, Nitrotab What should I tell my health care provider before I take this medicine? They need to know if you have any of these conditions:  anemia  head injury, recent stroke, or bleeding in the brain  liver disease  previous heart attack  an unusual or allergic reaction to nitroglycerin, other medicines, foods, dyes, or preservatives  pregnant or trying to get pregnant  breast-feeding How should I use this  medicine? Take this medicine by mouth as needed. At the first sign of an angina attack (chest pain or tightness) place one tablet under your tongue. You can also take this medicine 5 to 10 minutes before an event likely to produce chest pain. Follow the directions on the prescription label. Let the tablet dissolve under the tongue. Do not swallow whole. Replace the dose if you accidentally swallow it. It will help if your mouth is not dry. Saliva around the tablet will help it to dissolve more quickly. Do not eat or drink, smoke or chew tobacco while a tablet is dissolving. If you are not better within 5 minutes after taking ONE dose of nitroglycerin, call 9-1-1 immediately to seek emergency medical care. Do not take more than 3 nitroglycerin tablets over 15 minutes. If you take this medicine often to relieve symptoms of angina, your doctor or health care professional may provide you with different instructions to manage your symptoms. If symptoms do not go away after following these instructions, it is important to call 9-1-1 immediately. Do not take more than 3 nitroglycerin tablets over 15 minutes. Talk to your pediatrician regarding the use of this medicine in children. Special care may be needed. Overdosage: If you think you have taken too much of this medicine contact a poison control center or emergency room at once. NOTE: This medicine is only for you. Do not share this medicine with others. What if I miss a dose? This does not apply. This medicine is only used as needed. What may interact with this medicine? Do not take this medicine with any of the following medications:  certain migraine medicines like ergotamine and dihydroergotamine (DHE)  medicines used to treat erectile dysfunction like sildenafil, tadalafil, and vardenafil  riociguat This medicine may also interact with the following medications:  alteplase  aspirin  heparin  medicines for high blood pressure  medicines for  mental depression  other medicines used to treat angina  phenothiazines like chlorpromazine, mesoridazine, prochlorperazine, thioridazine This list may not describe all possible interactions. Give your health care provider a list of all the medicines, herbs, non-prescription drugs, or dietary supplements you use. Also tell them if you smoke, drink alcohol, or use illegal drugs. Some items may interact with your medicine. What should I watch for while using this medicine? Tell your doctor or health care professional if you feel your medicine is no longer working. Keep this medicine with you at all times. Sit  or lie down when you take your medicine to prevent falling if you feel dizzy or faint after using it. Try to remain calm. This will help you to feel better faster. If you feel dizzy, take several deep breaths and lie down with your feet propped up, or bend forward with your head resting between your knees. You may get drowsy or dizzy. Do not drive, use machinery, or do anything that needs mental alertness until you know how this drug affects you. Do not stand or sit up quickly, especially if you are an older patient. This reduces the risk of dizzy or fainting spells. Alcohol can make you more drowsy and dizzy. Avoid alcoholic drinks. Do not treat yourself for coughs, colds, or pain while you are taking this medicine without asking your doctor or health care professional for advice. Some ingredients may increase your blood pressure. What side effects may I notice from receiving this medicine? Side effects that you should report to your doctor or health care professional as soon as possible:  blurred vision  dry mouth  skin rash  sweating  the feeling of extreme pressure in the head  unusually weak or tired Side effects that usually do not require medical attention (report to your doctor or health care professional if they continue or are bothersome):  flushing of the face or  neck  headache  irregular heartbeat, palpitations  nausea, vomiting This list may not describe all possible side effects. Call your doctor for medical advice about side effects. You may report side effects to FDA at 1-800-FDA-1088. Where should I keep my medicine? Keep out of the reach of children. Store at room temperature between 20 and 25 degrees C (68 and 77 degrees F). Store in [facility] [employer]. Protect from light and moisture. Keep tightly closed. Throw away any unused medicine after the expiration date. NOTE: This sheet is a summary. It may not cover all possible information. If you have questions about this medicine, talk to your doctor, pharmacist, or health care provider.  2020 Elsevier/Gold Standard (2013-05-30 17:57:36)

## 2020-04-07 LAB — LIPID PANEL
Chol/HDL Ratio: 3.8 ratio (ref 0.0–4.4)
Cholesterol, Total: 203 mg/dL — ABNORMAL HIGH (ref 100–199)
HDL: 54 mg/dL (ref >39)
LDL Chol Calc (NIH): 132 mg/dL — ABNORMAL HIGH (ref 0–99)
Triglycerides: 93 mg/dL (ref 0–149)
VLDL Cholesterol Cal: 17 mg/dL (ref 5–40)

## 2020-04-07 LAB — HEPATIC FUNCTION PANEL
ALT: 47 IU/L — ABNORMAL HIGH (ref 0–32)
AST: 28 IU/L (ref 0–40)
Albumin: 5 g/dL — ABNORMAL HIGH (ref 3.8–4.9)
Alkaline Phosphatase: 93 IU/L (ref 48–121)
Bilirubin Total: 0.4 mg/dL (ref 0.0–1.2)
Bilirubin, Direct: 0.11 mg/dL (ref 0.00–0.40)
Total Protein: 7.3 g/dL (ref 6.0–8.5)

## 2020-04-07 LAB — TSH: TSH: 2.03 u[IU]/mL (ref 0.450–4.500)

## 2020-04-09 ENCOUNTER — Ambulatory Visit: Payer: Self-pay | Admitting: Psychology

## 2020-04-14 ENCOUNTER — Telehealth (HOSPITAL_COMMUNITY): Payer: Self-pay | Admitting: *Deleted

## 2020-04-14 MED ORDER — ROSUVASTATIN CALCIUM 5 MG PO TABS
5.0000 mg | ORAL_TABLET | Freq: Every day | ORAL | 6 refills | Status: DC
Start: 2020-04-14 — End: 2020-10-27

## 2020-04-14 NOTE — Telephone Encounter (Signed)
Left message on voicemail per DPR in reference to upcoming appointment scheduled on 04/16/20 with detailed instructions given per Myocardial Perfusion Study Information Sheet for the test. LM to arrive 15 minutes early, and that it is imperative to arrive on time for appointment to keep from having the test rescheduled. If you need to cancel or reschedule your appointment, please call the office within 24 hours of your appointment. Failure to do so may result in a cancellation of your appointment, and a $50 no show fee. Phone number given for call back for any questions. Kirstie Peri

## 2020-04-14 NOTE — Addendum Note (Signed)
Addended by: Truddie Hidden on: 04/14/2020 10:37 AM   Modules accepted: Orders

## 2020-04-16 ENCOUNTER — Ambulatory Visit (INDEPENDENT_AMBULATORY_CARE_PROVIDER_SITE_OTHER): Payer: 59

## 2020-04-16 ENCOUNTER — Other Ambulatory Visit: Payer: Self-pay

## 2020-04-16 DIAGNOSIS — R0789 Other chest pain: Secondary | ICD-10-CM

## 2020-04-16 DIAGNOSIS — E119 Type 2 diabetes mellitus without complications: Secondary | ICD-10-CM | POA: Diagnosis not present

## 2020-04-16 DIAGNOSIS — E663 Overweight: Secondary | ICD-10-CM | POA: Diagnosis not present

## 2020-04-16 DIAGNOSIS — I10 Essential (primary) hypertension: Secondary | ICD-10-CM | POA: Diagnosis not present

## 2020-04-16 DIAGNOSIS — R072 Precordial pain: Secondary | ICD-10-CM

## 2020-04-16 LAB — MYOCARDIAL PERFUSION IMAGING
LV dias vol: 56 mL (ref 46–106)
LV sys vol: 11 mL
Peak HR: 122 {beats}/min
Rest HR: 83 {beats}/min
SDS: 0
SRS: 0
SSS: 0
TID: 1

## 2020-04-16 MED ORDER — REGADENOSON 0.4 MG/5ML IV SOLN
0.4000 mg | Freq: Once | INTRAVENOUS | Status: AC
  Administered 2020-04-16: 0.4 mg via INTRAVENOUS

## 2020-04-16 MED ORDER — TECHNETIUM TC 99M TETROFOSMIN IV KIT
10.5000 | PACK | Freq: Once | INTRAVENOUS | Status: AC | PRN
  Administered 2020-04-16: 10.5 via INTRAVENOUS

## 2020-04-16 MED ORDER — TECHNETIUM TC 99M TETROFOSMIN IV KIT
31.7000 | PACK | Freq: Once | INTRAVENOUS | Status: AC | PRN
  Administered 2020-04-16: 31.7 via INTRAVENOUS

## 2020-04-17 ENCOUNTER — Telehealth: Payer: Self-pay

## 2020-04-17 NOTE — Telephone Encounter (Signed)
-----   Message from Jenean Lindau, MD sent at 04/17/2020  1:21 PM EDT ----- The results of the study is unremarkable. Please inform patient. I will discuss in detail at next appointment. Cc  primary care/referring physician Jenean Lindau, MD 04/17/2020 1:21 PM

## 2020-04-17 NOTE — Telephone Encounter (Signed)
Spoke with patient regarding results and recommendation.  Patient verbalizes understanding and is agreeable to plan of care. Advised patient to call back with any issues or concerns.  

## 2020-05-06 ENCOUNTER — Ambulatory Visit (INDEPENDENT_AMBULATORY_CARE_PROVIDER_SITE_OTHER): Payer: 59 | Admitting: Psychology

## 2020-05-06 DIAGNOSIS — F4323 Adjustment disorder with mixed anxiety and depressed mood: Secondary | ICD-10-CM | POA: Diagnosis not present

## 2020-05-22 ENCOUNTER — Ambulatory Visit (INDEPENDENT_AMBULATORY_CARE_PROVIDER_SITE_OTHER): Payer: 59 | Admitting: Psychology

## 2020-05-22 DIAGNOSIS — F4323 Adjustment disorder with mixed anxiety and depressed mood: Secondary | ICD-10-CM | POA: Diagnosis not present

## 2020-06-08 ENCOUNTER — Other Ambulatory Visit: Payer: Self-pay

## 2020-06-08 ENCOUNTER — Encounter: Payer: Self-pay | Admitting: Obstetrics and Gynecology

## 2020-06-08 ENCOUNTER — Ambulatory Visit (INDEPENDENT_AMBULATORY_CARE_PROVIDER_SITE_OTHER): Payer: 59 | Admitting: Obstetrics and Gynecology

## 2020-06-08 VITALS — BP 130/72 | HR 96 | Ht 64.5 in | Wt 186.0 lb

## 2020-06-08 DIAGNOSIS — Z1211 Encounter for screening for malignant neoplasm of colon: Secondary | ICD-10-CM

## 2020-06-08 DIAGNOSIS — Z01419 Encounter for gynecological examination (general) (routine) without abnormal findings: Secondary | ICD-10-CM | POA: Diagnosis not present

## 2020-06-08 NOTE — Progress Notes (Signed)
54 y.o. G68P2002 Married Caucasian female here for annual exam.   Sleep has improved.  Using melatonin as needed.  Having some leakage of her bladder with a cough or laugh if it is full.  Able to manage for now.   Bowel function is good.   She has an irritated area near her anus since her hysterectomy.  Uses clobetasol ointment which works.  Uses this prn.  Had complete cardiac work-up in July for chest tightness, heart racing and couldn't get good breath. Normal work up.  Normal stress test.  Had elevated liver function.  Was placed on cholesterol medication.   Patient lost dad 06/2019 to Alzheimer's and broken hip. Husband's parent passed last year also--non Covid related.  Sold their home of 27 years.   Husband had surgery and had complications with throat swelling.   Received her Covid vaccine.  No flu vaccine yet.   PCP: Antony Contras, MD  Patient's last menstrual period was 01/13/2006.           Sexually active: Yes.    The current method of family planning is status post hysterectomy--ovaries remain.    Exercising: Yes.    walking Smoker:  no  Health Maintenance: Pap: 2012 History of abnormal Pap:  no MMG: 12-24-19 Diag.Lt.Br./Probably benign,prob.biopsy-related change in Lt.Br./denisty B/Rec.Bil.Diag.05/2020--Appt/06-15-20 Colonoscopy:  2005 to r/o Crohn's Disease/Neg--patient knows she needs to schedule BMD:   n/a  Result  n/a TDaP:  2014 Gardasil:   no HIV: Neg in Pregnancy Hep C: 05-13-16 Neg Screening Labs:  PCP and cardiologist.    reports that she has never smoked. She has never used smokeless tobacco. She reports previous alcohol use. She reports that she does not use drugs.  Past Medical History:  Diagnosis Date  . Anxiety   . Arthritis   . 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 Complication of anesthesia   . Elevated hemoglobin A1c 2018  . Family history of anesthesia complication    Mother get sick on her stomach  . GERD (gastroesophageal  reflux disease)   . Hypertension   . IBS (irritable bowel syndrome)   . PONV (postoperative nausea and vomiting)   . Wears glasses     Past Surgical History:  Procedure Laterality Date  . CARPAL TUNNEL RELEASE Left 10/24/2014   Procedure: LEFT CARPAL TUNNEL ;  Surgeon: Roseanne Kaufman, MD;  Location: [facility];  Service: Orthopedics;  Laterality: Left;  . CESAREAN SECTION  1985  . CHOLECYSTECTOMY  1991  . COLONOSCOPY     Hx: of  . EYE SURGERY  2013   both cataracts  . MOHS SURGERY  07/2018   nose--basal cell  . ULNAR NERVE TRANSPOSITION Right 08/01/2013   Procedure: RIGHT LIMITED CARPAL TUNNEL RELEASE,RIGHT CUBITAL TUNNEL RELEASE; right cubital tunnel release INSITU ,EXCISION RIGHT MIDDLE FINGER MASS;  Surgeon: Roseanne Kaufman, MD;  Location: [facility];  Service: Orthopedics;  Laterality: Right;  . ULNAR NERVE TRANSPOSITION Left 10/24/2014   Procedure: ULNAR NERVE DECOMPRESSION AT ELBOW;  Surgeon: Roseanne Kaufman, MD;  Location: [facility];  Service: Orthopedics;  Laterality: Left;  Marland Kitchen VAGINAL HYSTERECTOMY  2007   ant/post repair  . WISDOM TOOTH EXTRACTION      Current Outpatient Medications  Medication Sig Dispense Refill  . ALPRAZolam (XANAX) 0.25 MG tablet Take 0.25 mg by mouth daily as needed for anxiety.     . betamethasone valerate ointment (VALISONE) 0.1 % Apply a pea sized amount to skin 2 x a  day for 1-2 weeks as needed 30 g 0  . Cholecalciferol (VITAMIN D) 2000 UNITS CAPS Take 2,000 Units by mouth daily.    Marland Kitchen esomeprazole (NEXIUM) 40 MG capsule Take 40 mg by mouth every evening.    . fluticasone (FLONASE) 50 MCG/ACT nasal spray Place 1 spray into both nostrils 2 (two) times daily.    Marland Kitchen loratadine (CLARITIN) 10 MG tablet Take 10 mg by mouth daily.    Marland Kitchen losartan-hydrochlorothiazide (HYZAAR) 100-12.5 MG per tablet Take 1 tablet by mouth daily.     . Magnesium 250 MG TABS Take 250 mg by mouth at bedtime.    . nitroGLYCERIN (NITROSTAT) 0.4 MG SL tablet  Place 1 tablet (0.4 mg total) under the tongue every 5 (five) minutes as needed. 25 tablet 6  . OVER THE COUNTER MEDICATION Take 5 mLs by mouth daily. "Leefy" liquid supplement with ginger, turmeric and pepper    . rosuvastatin (CRESTOR) 5 MG tablet Take 1 tablet (5 mg total) by mouth daily. 30 tablet 6   No current facility-administered medications for this visit.    Family History  Problem Relation Age of Onset  . Hypertension Mother   . Atrial fibrillation Mother   . Alzheimer's disease Father   . Bipolar disorder Other     Review of Systems  All other systems reviewed and are negative.   Exam:   BP 130/72 (Cuff Size: Large)   Pulse 96   Ht 5' 4.5" (1.638 m)   Wt 186 lb (84.4 kg)   LMP 01/13/2006   SpO2 99%   BMI 31.43 kg/m     General appearance: alert, cooperative and appears stated age Head: normocephalic, without obvious abnormality, atraumatic Neck: no adenopathy, supple, symmetrical, trachea midline and thyroid normal to inspection and palpation Lungs: clear to auscultation bilaterally Breasts: normal appearance, no masses or tenderness, No nipple retraction or dimpling, No nipple discharge or bleeding, No axillary adenopathy Heart: regular rate and rhythm Abdomen: soft, non-tender; no masses, no organomegaly Extremities: extremities normal, atraumatic, no cyanosis or edema Skin: skin color, texture, turgor normal. No rashes or lesions Lymph nodes: cervical, supraclavicular, and axillary nodes normal. Neurologic: grossly normal  Pelvic: External genitalia:  no lesions              No abnormal inguinal nodes palpated.              Urethra:  normal appearing urethra with no masses, tenderness or lesions              Bartholins and Skenes: normal                 Vagina: normal appearing vagina with normal color and discharge, no lesions              Cervix: absent              Pap taken: No. Bimanual Exam:  Uterus:  Absent.              Adnexa: no mass, fullness,  tenderness              Rectal exam: Yes.  .  Confirms.              Anus:  normal sphincter tone, fissures noted.   Lateral perianal region with 5 mm sebaceous cyst at 4:00, nontender.  Chaperone was present for exam.  Assessment:   Well woman visit with normal exam. Status post TVH and anterior and posterior colporrhaphy. Ovaries remain.  Prediabetic.  Mixed incontinence. Perianal fissures.  Plan: Mammogram screening discussed. Self breast awareness reviewed. Pap and HR HPV as above. Guidelines for Calcium, Vitamin D, regular exercise program including cardiovascular and weight bearing exercise. Kegel's recommended. Referral to gastroenterology for colonoscopy and perianal fissures. Follow up annually and prn.

## 2020-06-08 NOTE — Patient Instructions (Signed)

## 2020-06-16 ENCOUNTER — Ambulatory Visit (INDEPENDENT_AMBULATORY_CARE_PROVIDER_SITE_OTHER): Payer: 59 | Admitting: Psychology

## 2020-06-16 DIAGNOSIS — Z9889 Other specified postprocedural states: Secondary | ICD-10-CM | POA: Insufficient documentation

## 2020-06-16 DIAGNOSIS — Z8489 Family history of other specified conditions: Secondary | ICD-10-CM | POA: Insufficient documentation

## 2020-06-16 DIAGNOSIS — I10 Essential (primary) hypertension: Secondary | ICD-10-CM | POA: Insufficient documentation

## 2020-06-16 DIAGNOSIS — Z973 Presence of spectacles and contact lenses: Secondary | ICD-10-CM | POA: Insufficient documentation

## 2020-06-16 DIAGNOSIS — M1990 Unspecified osteoarthritis, unspecified site: Secondary | ICD-10-CM | POA: Insufficient documentation

## 2020-06-16 DIAGNOSIS — F4323 Adjustment disorder with mixed anxiety and depressed mood: Secondary | ICD-10-CM

## 2020-06-16 DIAGNOSIS — K219 Gastro-esophageal reflux disease without esophagitis: Secondary | ICD-10-CM | POA: Insufficient documentation

## 2020-06-16 DIAGNOSIS — T8859XA Other complications of anesthesia, initial encounter: Secondary | ICD-10-CM | POA: Insufficient documentation

## 2020-06-16 DIAGNOSIS — F419 Anxiety disorder, unspecified: Secondary | ICD-10-CM | POA: Insufficient documentation

## 2020-06-16 DIAGNOSIS — R112 Nausea with vomiting, unspecified: Secondary | ICD-10-CM | POA: Insufficient documentation

## 2020-06-17 ENCOUNTER — Other Ambulatory Visit: Payer: Self-pay

## 2020-06-17 ENCOUNTER — Ambulatory Visit (INDEPENDENT_AMBULATORY_CARE_PROVIDER_SITE_OTHER): Payer: 59 | Admitting: Cardiology

## 2020-06-17 ENCOUNTER — Encounter: Payer: Self-pay | Admitting: Cardiology

## 2020-06-17 VITALS — BP 126/70 | HR 76 | Ht 65.0 in | Wt 183.1 lb

## 2020-06-17 DIAGNOSIS — E782 Mixed hyperlipidemia: Secondary | ICD-10-CM | POA: Insufficient documentation

## 2020-06-17 DIAGNOSIS — E119 Type 2 diabetes mellitus without complications: Secondary | ICD-10-CM

## 2020-06-17 DIAGNOSIS — I10 Essential (primary) hypertension: Secondary | ICD-10-CM

## 2020-06-17 DIAGNOSIS — E663 Overweight: Secondary | ICD-10-CM

## 2020-06-17 NOTE — Progress Notes (Signed)
Cardiology Office Note:    Date:  06/17/2020   ID:  Judy Jones, DOB 08-05-1966, MRN [id number]  PCP:  Antony Contras, MD  Cardiologist:  Jenean Lindau, MD   Referring MD: Antony Contras, MD    ASSESSMENT:    1. Essential hypertension   2. Diet-controlled diabetes mellitus ([place])   3. Overweight   4. Mixed dyslipidemia    PLAN:    In order of problems listed above:  1. Primary prevention stressed with the patient.  Importance of compliance with diet medication stressed and she vocalized understanding.  Importance of regular exercise stressed and she walks at least half an hour a day on a regular basis. 2. Essential hypertension: Blood pressure stable, lifestyle modification and diet was emphasized. 3. Mixed dyslipidemia: On low-dose statin therapy check blood work today.  We will also check liver panel since liver was mildly elevated last time.  She is aware of this. 4. Diabetes mellitus: Diet controlled and she is managing well with her diet.  I had a discussion with her about this and she plans to see her primary care physician also to keep a track of it. 5. Overweight: She is doing well with diet and exercise and promises more weight loss in the next few months.Patient will be seen in follow-up appointment in 6 months or earlier if the patient has any concerns    Medication Adjustments/Labs and Tests Ordered: Current medicines are reviewed at length with the patient today.  Concerns regarding medicines are outlined above.  Orders Placed This Encounter  Procedures  . Basic metabolic panel  . CBC with Differential/Platelet  . Hepatic function panel  . TSH  . Lipid panel   No orders of the defined types were placed in this encounter.    No chief complaint on file.    History of Present Illness:    Judy Jones is a 54 y.o. female.  Patient has past medical history of essential hypertension, mixed dyslipidemia and diet-controlled diabetes mellitus.   She denies any problems at this time and takes care of activities of daily living.  No chest pain orthopnea or PND.  With her diet and exercise she has lost 7 pounds.  At the time of my evaluation, the patient is alert awake oriented and in no distress.  Past Medical History:  Diagnosis Date  . ALLERGIC RHINITIS 05/30/2008   Qualifier: Diagnosis of  By: Chase Caller MD, Murali    . Anxiety   . Arthritis   . Cancer ([place]) 07/2018   Basal cell--nose  . Chest tightness 04/06/2020  . Complication of anesthesia   . Cough 05/30/2008   Qualifier: Diagnosis of  By: Chase Caller MD, Murali    . Diet-controlled diabetes mellitus ([place]) 04/06/2020  . Elevated hemoglobin A1c 2018  . Essential hypertension 04/06/2020  . Family history of anesthesia complication    Mother get sick on her stomach  . Genuine stress incontinence, female 04/23/2014  . GERD (gastroesophageal reflux disease)   . Hypertension   . IBS (irritable bowel syndrome)   . Overweight 04/06/2020  . PONV (postoperative nausea and vomiting)   . Wears glasses     Past Surgical History:  Procedure Laterality Date  . CARPAL TUNNEL RELEASE Left 10/24/2014   Procedure: LEFT CARPAL TUNNEL ;  Surgeon: Roseanne Kaufman, MD;  Location: [facility];  Service: Orthopedics;  Laterality: Left;  . CESAREAN SECTION  1985  . CHOLECYSTECTOMY  1991  . COLONOSCOPY  Hx: of  . EYE SURGERY  2013   both cataracts  . MOHS SURGERY  07/2018   nose--basal cell  . ULNAR NERVE TRANSPOSITION Right 08/01/2013   Procedure: RIGHT LIMITED CARPAL TUNNEL RELEASE,RIGHT CUBITAL TUNNEL RELEASE; right cubital tunnel release INSITU ,EXCISION RIGHT MIDDLE FINGER MASS;  Surgeon: Roseanne Kaufman, MD;  Location: [facility];  Service: Orthopedics;  Laterality: Right;  . ULNAR NERVE TRANSPOSITION Left 10/24/2014   Procedure: ULNAR NERVE DECOMPRESSION AT ELBOW;  Surgeon: Roseanne Kaufman, MD;  Location: [facility];  Service: Orthopedics;  Laterality: Left;  Marland Kitchen  VAGINAL HYSTERECTOMY  2007   ant/post repair  . WISDOM TOOTH EXTRACTION      Current Medications: Current Meds  Medication Sig  . ALPRAZolam (XANAX) 0.25 MG tablet Take 0.25 mg by mouth daily as needed for anxiety.   . Cholecalciferol (VITAMIN D) 2000 UNITS CAPS Take 2,000 Units by mouth daily.  Marland Kitchen esomeprazole (NEXIUM) 40 MG capsule Take 40 mg by mouth every evening.  . fluticasone (FLONASE) 50 MCG/ACT nasal spray Place 1 spray into both nostrils as needed.   . loratadine (CLARITIN) 10 MG tablet Take 10 mg by mouth daily.  Marland Kitchen losartan-hydrochlorothiazide (HYZAAR) 100-12.5 MG per tablet Take 1 tablet by mouth daily.   . Magnesium 250 MG TABS Take 250 mg by mouth at bedtime.  . nitroGLYCERIN (NITROSTAT) 0.4 MG SL tablet Place 1 tablet (0.4 mg total) under the tongue every 5 (five) minutes as needed.  Marland Kitchen OVER THE COUNTER MEDICATION Take 5 mLs by mouth daily. "Leefy" liquid supplement with ginger, turmeric and pepper  . rosuvastatin (CRESTOR) 5 MG tablet Take 1 tablet (5 mg total) by mouth daily.     Allergies:   Shellfish allergy, Flagyl [metronidazole], Morphine, Other, Penicillins, and Percocet [oxycodone-acetaminophen]   Social History   Socioeconomic History  . Marital status: Married    Spouse name: Not on file  . Number of children: Not on file  . Years of education: Not on file  . Highest education level: Not on file  Occupational History  . Not on file  Tobacco Use  . Smoking status: Never Smoker  . Smokeless tobacco: Never Used  Vaping Use  . Vaping Use: Never used  Substance and Sexual Activity  . Alcohol use: Not Currently    Comment: rarely  . Drug use: No  . Sexual activity: Yes    Partners: Male    Birth control/protection: Surgical    Comment: TVH--ovaries remain  Other Topics Concern  . Not on file  Social History Narrative  . Not on file   Social Determinants of Health   Financial Resource Strain:   . Difficulty of Paying Living Expenses: Not on file    Food Insecurity:   . Worried About Charity fundraiser in the Last Year: Not on file  . Ran Out of Food in the Last Year: Not on file  Transportation Needs:   . Lack of Transportation (Medical): Not on file  . Lack of Transportation (Non-Medical): Not on file  Physical Activity:   . Days of Exercise per Week: Not on file  . Minutes of Exercise per Session: Not on file  Stress:   . Feeling of Stress : Not on file  Social Connections:   . Frequency of Communication with Friends and Family: Not on file  . Frequency of Social Gatherings with Friends and Family: Not on file  . Attends Religious Services: Not on file  . Active Member of Clubs  or Organizations: Not on file  . Attends Archivist Meetings: Not on file  . Marital Status: Not on file     Family History: The patient's family history includes Alzheimer's disease in her father; Atrial fibrillation in her mother; Bipolar disorder in an other family member; Hypertension in her mother.  ROS:   Please see the history of present illness.    All other systems reviewed and are negative.  EKGs/Labs/Other Studies Reviewed:    The following studies were reviewed today: Study Highlights   Nuclear stress EF: 80%.  The left ventricular ejection fraction is hyperdynamic (>65%).  There was no ST segment deviation noted during stress.  No T wave inversion was noted during stress.  The study is normal.  This is a low risk study.    Recent Labs: 03/11/2020: BUN 17; Creatinine, Ser 0.83; Hemoglobin 15.5; Platelets 246; Potassium 3.7; Sodium 139 04/06/2020: ALT 47; TSH 2.030  Recent Lipid Panel    Component Value Date/Time   CHOL 203 (H) 04/06/2020 0936   TRIG 93 04/06/2020 0936   HDL 54 04/06/2020 0936   CHOLHDL 3.8 04/06/2020 0936   LDLCALC 132 (H) 04/06/2020 0936    Physical Exam:    VS:  BP 126/70   Pulse 76   Ht 5\' 5"  (1.651 m)   Wt 183 lb 1.9 oz (83.1 kg)   LMP 01/13/2006   SpO2 96%   BMI 30.47 kg/m      Wt Readings from Last 3 Encounters:  06/17/20 183 lb 1.9 oz (83.1 kg)  06/08/20 186 lb (84.4 kg)  04/16/20 190 lb (86.2 kg)     GEN: Patient is in no acute distress HEENT: Normal NECK: No JVD; No carotid bruits LYMPHATICS: No lymphadenopathy CARDIAC: Hear sounds regular, 2/6 systolic murmur at the apex. RESPIRATORY:  Clear to auscultation without rales, wheezing or rhonchi  ABDOMEN: Soft, non-tender, non-distended MUSCULOSKELETAL:  No edema; No deformity  SKIN: Warm and dry NEUROLOGIC:  Alert and oriented x 3 PSYCHIATRIC:  Normal affect   Signed, Jenean Lindau, MD  06/17/2020 9:34 AM    [place]

## 2020-06-17 NOTE — Patient Instructions (Signed)

## 2020-06-18 LAB — CBC WITH DIFFERENTIAL/PLATELET
Basophils Absolute: 0.1 10*3/uL (ref 0.0–0.2)
Basos: 1 %
EOS (ABSOLUTE): 0.3 10*3/uL (ref 0.0–0.4)
Eos: 4 %
Hematocrit: 43.5 % (ref 34.0–46.6)
Hemoglobin: 15.1 g/dL (ref 11.1–15.9)
Immature Grans (Abs): 0 10*3/uL (ref 0.0–0.1)
Immature Granulocytes: 0 %
Lymphocytes Absolute: 2.3 10*3/uL (ref 0.7–3.1)
Lymphs: 30 %
MCH: 31.6 pg (ref 26.6–33.0)
MCHC: 34.7 g/dL (ref 31.5–35.7)
MCV: 91 fL (ref 79–97)
Monocytes Absolute: 0.4 10*3/uL (ref 0.1–0.9)
Monocytes: 5 %
Neutrophils Absolute: 4.7 10*3/uL (ref 1.4–7.0)
Neutrophils: 60 %
Platelets: 224 10*3/uL (ref 150–450)
RBC: 4.78 x10E6/uL (ref 3.77–5.28)
RDW: 12.7 % (ref 11.7–15.4)
WBC: 7.9 10*3/uL (ref 3.4–10.8)

## 2020-06-18 LAB — HEPATIC FUNCTION PANEL
ALT: 33 IU/L — ABNORMAL HIGH (ref 0–32)
AST: 23 IU/L (ref 0–40)
Albumin: 4.8 g/dL (ref 3.8–4.9)
Alkaline Phosphatase: 95 IU/L (ref 44–121)
Bilirubin Total: 0.4 mg/dL (ref 0.0–1.2)
Bilirubin, Direct: 0.15 mg/dL (ref 0.00–0.40)
Total Protein: 7 g/dL (ref 6.0–8.5)

## 2020-06-18 LAB — BASIC METABOLIC PANEL
BUN/Creatinine Ratio: 18 (ref 9–23)
BUN: 18 mg/dL (ref 6–24)
CO2: 26 mmol/L (ref 20–29)
Calcium: 9.9 mg/dL (ref 8.7–10.2)
Chloride: 99 mmol/L (ref 96–106)
Creatinine, Ser: 1.02 mg/dL — ABNORMAL HIGH (ref 0.57–1.00)
GFR calc Af Amer: 71 mL/min/{1.73_m2} (ref >59)
GFR calc non Af Amer: 62 mL/min/{1.73_m2} (ref >59)
Glucose: 119 mg/dL — ABNORMAL HIGH (ref 65–99)
Potassium: 4.7 mmol/L (ref 3.5–5.2)
Sodium: 137 mmol/L (ref 134–144)

## 2020-06-18 LAB — LIPID PANEL
Chol/HDL Ratio: 3.1 ratio (ref 0.0–4.4)
Cholesterol, Total: 162 mg/dL (ref 100–199)
HDL: 53 mg/dL (ref >39)
LDL Chol Calc (NIH): 93 mg/dL (ref 0–99)
Triglycerides: 84 mg/dL (ref 0–149)
VLDL Cholesterol Cal: 16 mg/dL (ref 5–40)

## 2020-06-18 LAB — TSH: TSH: 2.22 u[IU]/mL (ref 0.450–4.500)

## 2020-07-11 ENCOUNTER — Encounter (HOSPITAL_BASED_OUTPATIENT_CLINIC_OR_DEPARTMENT_OTHER): Payer: Self-pay | Admitting: *Deleted

## 2020-07-11 ENCOUNTER — Emergency Department (HOSPITAL_BASED_OUTPATIENT_CLINIC_OR_DEPARTMENT_OTHER): Payer: 59

## 2020-07-11 ENCOUNTER — Other Ambulatory Visit: Payer: Self-pay

## 2020-07-11 ENCOUNTER — Emergency Department (HOSPITAL_BASED_OUTPATIENT_CLINIC_OR_DEPARTMENT_OTHER)
Admission: EM | Admit: 2020-07-11 | Discharge: 2020-07-11 | Disposition: A | Discharge status: Home / Self Care | Payer: 59 | Attending: Emergency Medicine | Admitting: Emergency Medicine

## 2020-07-11 DIAGNOSIS — W1842XA Slipping, tripping and stumbling without falling due to stepping into hole or opening, initial encounter: Secondary | ICD-10-CM | POA: Insufficient documentation

## 2020-07-11 DIAGNOSIS — S99911A Unspecified injury of right ankle, initial encounter: Secondary | ICD-10-CM | POA: Diagnosis present

## 2020-07-11 DIAGNOSIS — S93401A Sprain of unspecified ligament of right ankle, initial encounter: Secondary | ICD-10-CM | POA: Diagnosis not present

## 2020-07-11 DIAGNOSIS — I10 Essential (primary) hypertension: Secondary | ICD-10-CM | POA: Diagnosis not present

## 2020-07-11 NOTE — ED Provider Notes (Signed)
[facility] Provider Note   CSN: [id number] Arrival date & time: 07/11/20  1856     History Chief Complaint  Patient presents with  . Ankle Pain    Judy Jones is a 54 y.o. female.  HPI Patient is a 54 year old female presented today with right ankle pain is been ongoing since she stepped into a hole earlier today and felt her ankle gave way.  She states that there is pain and swelling.  She denies any bruising.  She took some Aleve which she states improves her symptoms.  She states is achy constant worse with touch and movement.  No other aggravating relieving factors.  No other associated symptoms.  She has tried no medications prior to arrival other than Aleve.    Past Medical History:  Diagnosis Date  . ALLERGIC RHINITIS 05/30/2008   Qualifier: Diagnosis of  By: Chase Caller MD, Murali    . Anxiety   . Arthritis   . 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 Chest tightness 04/06/2020  . Complication of anesthesia   . Cough 05/30/2008   Qualifier: Diagnosis of  By: Chase Caller MD, Murali    . Diet-controlled diabetes mellitus ([place]) 04/06/2020  . Elevated hemoglobin A1c 2018  . Essential hypertension 04/06/2020  . Family history of anesthesia complication    Mother get sick on her stomach  . Genuine stress incontinence, female 04/23/2014  . GERD (gastroesophageal reflux disease)   . Hypertension   . IBS (irritable bowel syndrome)   . Overweight 04/06/2020  . PONV (postoperative nausea and vomiting)   . Wears glasses     Patient Active Problem List   Diagnosis Date Noted  . Mixed dyslipidemia 06/17/2020  . Anxiety   . Arthritis   . Complication of anesthesia   . Family history of anesthesia complication   . GERD (gastroesophageal reflux disease)   . Hypertension   . PONV (postoperative nausea and vomiting)   . Wears glasses   . Chest tightness 04/06/2020  . Essential hypertension 04/06/2020  . Diet-controlled diabetes  mellitus ([place]) 04/06/2020  . Overweight 04/06/2020  . Cancer ([place]) 07/2018  . Elevated hemoglobin A1c 2018  . IBS (irritable bowel syndrome)   . Genuine stress incontinence, female 04/23/2014  . ALLERGIC RHINITIS 05/30/2008  . COUGH 05/30/2008    Past Surgical History:  Procedure Laterality Date  . CARPAL TUNNEL RELEASE Left 10/24/2014   Procedure: LEFT CARPAL TUNNEL ;  Surgeon: Roseanne Kaufman, MD;  Location: [facility];  Service: Orthopedics;  Laterality: Left;  . CESAREAN SECTION  1985  . CHOLECYSTECTOMY  1991  . COLONOSCOPY     Hx: of  . EYE SURGERY  2013   both cataracts  . MOHS SURGERY  07/2018   nose--basal cell  . ULNAR NERVE TRANSPOSITION Right 08/01/2013   Procedure: RIGHT LIMITED CARPAL TUNNEL RELEASE,RIGHT CUBITAL TUNNEL RELEASE; right cubital tunnel release INSITU ,EXCISION RIGHT MIDDLE FINGER MASS;  Surgeon: Roseanne Kaufman, MD;  Location: [facility];  Service: Orthopedics;  Laterality: Right;  . ULNAR NERVE TRANSPOSITION Left 10/24/2014   Procedure: ULNAR NERVE DECOMPRESSION AT ELBOW;  Surgeon: Roseanne Kaufman, MD;  Location: [facility];  Service: Orthopedics;  Laterality: Left;  Marland Kitchen VAGINAL HYSTERECTOMY  2007   ant/post repair  . WISDOM TOOTH EXTRACTION       OB History    Gravida  2   Para  2   Term  2   Preterm  AB      Living  2     SAB      TAB      Ectopic      Multiple      Live Births              Family History  Problem Relation Age of Onset  . Hypertension Mother   . Atrial fibrillation Mother   . Alzheimer's disease Father   . Bipolar disorder Other     Social History   Tobacco Use  . Smoking status: Never Smoker  . Smokeless tobacco: Never Used  Vaping Use  . Vaping Use: Never used  Substance Use Topics  . Alcohol use: Not Currently    Comment: rarely  . Drug use: No    Home Medications Prior to Admission medications   Medication Sig Start Date End Date Taking? Authorizing Provider    ALPRAZolam Duanne Moron) 0.25 MG tablet Take 0.25 mg by mouth daily as needed for anxiety.     [provider]  Cholecalciferol (VITAMIN D) 2000 UNITS CAPS Take 2,000 Units by mouth daily.    [provider]  esomeprazole (NEXIUM) 40 MG capsule Take 40 mg by mouth every evening.    [provider]  fluticasone (FLONASE) 50 MCG/ACT nasal spray Place 1 spray into both nostrils as needed.  03/18/20   [provider]  loratadine (CLARITIN) 10 MG tablet Take 10 mg by mouth daily.    [provider]  losartan-hydrochlorothiazide (HYZAAR) 100-12.5 MG per tablet Take 1 tablet by mouth daily.  04/08/15   [provider]  Magnesium 250 MG TABS Take 250 mg by mouth at bedtime.    [provider]  nitroGLYCERIN (NITROSTAT) 0.4 MG SL tablet Place 1 tablet (0.4 mg total) under the tongue every 5 (five) minutes as needed. 04/06/20 07/05/20  Revankar, Reita Cliche, MD  OVER THE COUNTER MEDICATION Take 5 mLs by mouth daily. "Leefy" liquid supplement with ginger, turmeric and pepper    [provider]  rosuvastatin (CRESTOR) 5 MG tablet Take 1 tablet (5 mg total) by mouth daily. 04/14/20 07/13/20  Revankar, Reita Cliche, MD    Allergies    Shellfish allergy, Flagyl [metronidazole], Morphine, Other, Penicillins, and Percocet [oxycodone-acetaminophen]  Review of Systems   Review of Systems  Constitutional: Negative for fever.  HENT: Negative for congestion.   Respiratory: Negative for shortness of breath.   Cardiovascular: Negative for chest pain.  Gastrointestinal: Negative for abdominal distention.  Musculoskeletal:       Right ankle pain  Neurological: Negative for dizziness and headaches.    Physical Exam Updated Vital Signs BP (!) 160/82 (BP Location: Right Arm)   Pulse 99   Temp 98.5 F (36.9 C) (Oral)   Resp 16   Ht 5\' 5"  (1.651 m)   Wt 83 kg   LMP 01/13/2006   SpO2 99%   BMI 30.45 kg/m   Physical Exam Vitals and nursing note reviewed.   Constitutional:      General: She is not in acute distress.    Appearance: Normal appearance. She is not ill-appearing.  HENT:     Head: Normocephalic and atraumatic.  Eyes:     General: No scleral icterus.       Right eye: No discharge.        Left eye: No discharge.     Conjunctiva/sclera: Conjunctivae normal.  Pulmonary:     Effort: Pulmonary effort is normal.     Breath sounds:  No stridor.  Musculoskeletal:     Comments: Tenderness and swelling over the right lateral malleolus.  No bruising.  Thompson test indicative of intact Achilles tendon. No tenderness palpation of the right fifth metatarsal.  No bruising step-off or deformity of the rest of the ankle.  No tenderness of the medial or posterior malleolus. Sensation intact in all toes with good cap refill and sensation.  Skin:    General: Skin is warm and dry.     Capillary Refill: Capillary refill takes less than 2 seconds.     Comments: Swelling noted over the lateral right malleolus  Neurological:     Mental Status: She is alert and oriented to person, place, and time. Mental status is at baseline.     ED Results / Procedures / Treatments   Labs (all labs ordered are listed, but only abnormal results are displayed) Labs Reviewed - No data to display  EKG None  Radiology DG Ankle Complete Right  Result Date: 07/11/2020 CLINICAL DATA:  Status post trauma. EXAM: RIGHT ANKLE - COMPLETE 3+ VIEW COMPARISON:  None. FINDINGS: There is no evidence of fracture, dislocation, or joint effusion. There is no evidence of arthropathy. A moderate-sized plantar calcaneal spur is seen. There is mild diffuse soft tissue swelling. IMPRESSION: 1. Mild diffuse soft tissue swelling without an acute fracture. Electronically Signed   By: Virgina Norfolk M.D.   On: 07/11/2020 20:06    Procedures Procedures (including critical care time)  Medications Ordered in ED Medications - No data to display  ED Course  I have reviewed the  triage vital signs and the nursing notes.  Pertinent labs & imaging results that were available during my care of the patient were reviewed by me and considered in my medical decision making (see chart for details).    MDM Rules/Calculators/A&P                          Patient is a 54 year old female presented today with right ankle pain after stepping into a hole.  She has swelling and tenderness to palpation of the right lateral malleolus no other tenderness to palpation notable.  X-ray reviewed on his office negative for any acute fracture or dislocation.  There is some diffuse swelling noted on x-ray.  This is consistent with my physical exam .  Patient has a ankle sprain.   Placed in ASO ankle wrap and given crutches.  She will [facility] with [facility].  Final Clinical Impression(s) / ED Diagnoses Final diagnoses:  Sprain of right ankle, unspecified ligament, initial encounter    Rx / DC Orders ED Discharge Orders    None       Tedd Sias, [place] 07/11/20 2131    Little, Wenda Overland, MD 07/12/20 1545

## 2020-07-11 NOTE — Discharge Instructions (Signed)
Your x-ray is negative for any fractures.  Your symptoms are consistent with ankle sprain.  Please rest ice and elevate your ankle.  Please use the ankle brace that I provided you with.  Please use crutches as needed.  Please alternate Tylenol and Profen as discussed below.  Please drink plenty of water.  Please follow-up with an orthopedist if you did have issues although this may take some time to heal I have provided you the information for an Ortho clinic in [place].

## 2020-07-11 NOTE — ED Triage Notes (Signed)
Pt states she stepped in a hole and injured her right ankle today. Pain and swelling noted. Pt took aleve pta

## 2020-07-29 ENCOUNTER — Ambulatory Visit: Payer: 59 | Admitting: Psychology

## 2020-07-31 ENCOUNTER — Other Ambulatory Visit: Payer: Self-pay | Admitting: Obstetrics and Gynecology

## 2020-07-31 ENCOUNTER — Ambulatory Visit
Admission: RE | Admit: 2020-07-31 | Discharge: 2020-07-31 | Disposition: A | Discharge status: Home / Self Care | Payer: 59 | Source: Ambulatory Visit | Attending: Obstetrics and Gynecology | Admitting: Obstetrics and Gynecology

## 2020-07-31 ENCOUNTER — Other Ambulatory Visit: Payer: Self-pay

## 2020-07-31 DIAGNOSIS — N6489 Other specified disorders of breast: Secondary | ICD-10-CM

## 2020-10-02 ENCOUNTER — Ambulatory Visit (AMBULATORY_SURGERY_CENTER): Payer: Self-pay | Admitting: *Deleted

## 2020-10-02 ENCOUNTER — Other Ambulatory Visit: Payer: Self-pay

## 2020-10-02 VITALS — Ht 65.0 in | Wt 186.0 lb

## 2020-10-02 DIAGNOSIS — Z1211 Encounter for screening for malignant neoplasm of colon: Secondary | ICD-10-CM

## 2020-10-02 MED ORDER — PLENVU 140 G PO SOLR: 1.0000 | ORAL | 0 refills | Status: DC

## 2020-10-02 NOTE — Progress Notes (Signed)
No egg or soy allergy known to patient  No issues with past sedation with any surgeries or procedures No intubation problems in the past  No FH of Malignant Hyperthermia No diet pills per patient No home 02 use per patient  No blood thinners per patient  Pt denies issues with constipation  No A fib or A flutter  EMMI video to pt or via [place] 19 guidelines implemented in PV today with Pt and RN  Pt is fully vaccinated  for Covid   Plenvu  Coupon given to pt in PV today , Code to Pharmacy and  NO PA's for preps discussed with pt In PV today  Discussed with pt there will be an out-of-pocket cost for prep and that varies from $0 to 70 dollars   Due to the COVID-19 pandemic we are asking patients to follow certain guidelines.  Pt aware of COVID protocols and [facility] guidelines   Pt states she thinks she has anal fissures- wants this checked at [facility]

## 2020-10-15 ENCOUNTER — Encounter: Payer: Self-pay | Admitting: Gastroenterology

## 2020-10-23 ENCOUNTER — Other Ambulatory Visit: Payer: Self-pay

## 2020-10-23 ENCOUNTER — Encounter: Payer: Self-pay | Admitting: Gastroenterology

## 2020-10-23 ENCOUNTER — Ambulatory Visit (AMBULATORY_SURGERY_CENTER): Payer: 59 | Admitting: Gastroenterology

## 2020-10-23 VITALS — BP 118/73 | HR 83 | Temp 98.6°F | Resp 12 | Ht 65.0 in | Wt 186.0 lb

## 2020-10-23 DIAGNOSIS — K6289 Other specified diseases of anus and rectum: Secondary | ICD-10-CM

## 2020-10-23 DIAGNOSIS — Z1211 Encounter for screening for malignant neoplasm of colon: Secondary | ICD-10-CM

## 2020-10-23 MED ORDER — SODIUM CHLORIDE 0.9 % IV SOLN: 500.0000 mL | INTRAVENOUS | Status: AC

## 2020-10-23 NOTE — Progress Notes (Signed)
pt tolerated well. VSS. awake and to recovery. Report given to RN.  

## 2020-10-23 NOTE — Patient Instructions (Signed)
Start a high fiber diet, return to normal activities tomorrow.  Await pathology results Repeat colonoscopy in 10 years for screening  YOU HAD AN ENDOSCOPIC PROCEDURE TODAY AT [facility]:   Refer to the procedure report that was given to you for any specific questions about what was found during the examination.  If the procedure report does not answer your questions, please call your gastroenterologist to clarify.  If you requested that your care partner not be given the details of your procedure findings, then the procedure report has been included in a sealed envelope for you to review at your convenience later.  YOU SHOULD EXPECT: Some feelings of bloating in the abdomen. Passage of more gas than usual.  Walking can help get rid of the air that was put into your GI tract during the procedure and reduce the bloating. If you had a lower endoscopy (such as a colonoscopy or flexible sigmoidoscopy) you may notice spotting of blood in your stool or on the toilet paper. If you underwent a bowel prep for your procedure, you may not have a normal bowel movement for a few days.  Please Note:  You might notice some irritation and congestion in your nose or some drainage.  This is from the oxygen used during your procedure.  There is no need for concern and it should clear up in a day or so.  SYMPTOMS TO REPORT IMMEDIATELY:   Following lower endoscopy (colonoscopy or flexible sigmoidoscopy):  Excessive amounts of blood in the stool  Significant tenderness or worsening of abdominal pains  Swelling of the abdomen that is new, acute  Fever of 100F or higher   For urgent or emergent issues, a gastroenterologist can be reached at any hour by calling [phone]. Do not use MyChart messaging for urgent concerns.    DIET:  We do recommend a small meal at first, but then you may proceed to your regular diet.  Drink plenty of fluids but you should avoid alcoholic beverages for 24  hours.  ACTIVITY:  You should plan to take it easy for the rest of today and you should NOT DRIVE or use heavy machinery until tomorrow (because of the sedation medicines used during the test).    FOLLOW UP: Our staff will call the number listed on your records 48-72 hours following your procedure to check on you and address any questions or concerns that you may have regarding the information given to you following your procedure. If we do not reach you, we will leave a message.  We will attempt to reach you two times.  During this call, we will ask if you have developed any symptoms of COVID 19. If you develop any symptoms (ie: fever, flu-like symptoms, shortness of breath, cough etc.) before then, please call [phone].  If you test positive for Covid 19 in the 2 weeks post procedure, please call and report this information to [place].    If any biopsies were taken you will be contacted by phone or by letter within the next 1-3 weeks.  Please call us at [phone] if you have not heard about the biopsies in 3 weeks.    SIGNATURES/CONFIDENTIALITY: You and/or your care partner have signed paperwork which will be entered into your electronic medical record.  These signatures attest to the fact that that the information above on your After Visit Summary has been reviewed and is understood.  Full responsibility of the confidentiality of this discharge information lies with  you and/or your care-partner.

## 2020-10-23 NOTE — Progress Notes (Signed)
Called to room to assist during endoscopic procedure.  Patient ID and intended procedure confirmed with present staff. Received instructions for my participation in the procedure from the performing physician.  

## 2020-10-23 NOTE — Op Note (Signed)
[facility] Patient Name: Judy Jones Procedure Date: 10/23/2020 11:13 AM MRN: [id number] Endoscopist: Justice Britain , MD Age: 55 Referring MD:  Date of Birth: 10-10-1965 Gender: Female Account #: [id number] Procedure:                Colonoscopy Indications:              Screening for colorectal malignant neoplasm Medicines:                Monitored Anesthesia Care Procedure:                Pre-Anesthesia Assessment:                           - Prior to the procedure, a History and Physical                            was performed, and patient medications and                            allergies were reviewed. The patient's tolerance of                            previous anesthesia was also reviewed. The risks                            and benefits of the procedure and the sedation                            options and risks were discussed with the patient.                            All questions were answered, and informed consent                            was obtained. Prior Anticoagulants: The patient has                            taken no previous anticoagulant or antiplatelet                            agents. ASA Grade Assessment: II - A patient with                            mild systemic disease. After reviewing the risks                            and benefits, the patient was deemed in                            satisfactory condition to undergo the procedure.                           After obtaining informed consent, the colonoscope  was passed under direct vision. Throughout the                            procedure, the patient's blood pressure, pulse, and                            oxygen saturations were monitored continuously. The                            Colonoscope was introduced through the anus and                            advanced to the 5 cm into the ileum. The                            colonoscopy was  somewhat difficult due to                            significant looping. Successful completion of the                            procedure was aided by changing the patient's                            position, using manual pressure, straightening and                            shortening the scope to obtain bowel loop reduction                            and using scope torsion. The patient tolerated the                            procedure. The quality of the bowel preparation was                            good. The terminal ileum, ileocecal valve,                            appendiceal orifice, and rectum were photographed. Scope In: 11:20:44 AM Scope Out: 11:34:15 AM Scope Withdrawal Time: 0 hours 8 minutes 10 seconds  Total Procedure Duration: 0 hours 13 minutes 31 seconds  Findings:                 The digital rectal exam findings include                            hemorrhoids. Pertinent negatives include no                            palpable rectal lesions.                           The colon (entire examined portion) revealed  moderately excessive looping.                           The terminal ileum and ileocecal valve appeared                            normal.                           A diffuse area of moderately erythematous and                            friable (with contact bleeding) mucosa was found in                            the middle and distal rectum. Biopsies were taken                            with a cold forceps for histology to rule out                            chronic proctitis.                           Normal mucosa was found in the entire colon                            otherwise.                           Non-bleeding non-thrombosed internal hemorrhoids                            were found during retroflexion, during perianal                            exam and during digital exam. The hemorrhoids were                             Grade II (internal hemorrhoids that prolapse but                            reduce spontaneously). Complications:            No immediate complications. Estimated Blood Loss:     Estimated blood loss was minimal. Impression:               - Hemorrhoids found on digital rectal exam.                           - There was significant looping of the colon.                           - The examined portion of the ileum was normal.                           -  Erythematous and friable (with contact bleeding)                            mucosa in the middle/distal rectum. Biopsied to                            rule out chronic proctitis v preparation artifact.                           - Normal mucosa in the entire examined colon                            otherwise.                           - Non-bleeding non-thrombosed internal hemorrhoids. Recommendation:           - The patient will be observed post-procedure,                            until all discharge criteria are met.                           - Discharge patient to home.                           - Patient has a contact number available for                            emergencies. The signs and symptoms of potential                            delayed complications were discussed with the                            patient. Return to normal activities tomorrow.                            Written discharge instructions were provided to the                            patient.                           - High fiber diet.                           - Await pathology results.                           - If patient desires further workup/discussion in                            regards to bowel habits (incidentally discussed                            today prior  to colonoscopy), can consider clinic                            visit. Query Cholestyramine use for possible                            bile-salt issues in future.                            - Repeat colonoscopy in 10 years for screening                            purposes.                           - The findings and recommendations were discussed                            with the patient.                           - The findings and recommendations were discussed                            with the patient's family. Justice Britain, MD 10/23/2020 11:43:44 AM

## 2020-10-27 ENCOUNTER — Other Ambulatory Visit: Payer: Self-pay

## 2020-10-27 ENCOUNTER — Telehealth: Payer: Self-pay

## 2020-10-27 MED ORDER — ROSUVASTATIN CALCIUM 5 MG PO TABS
5.0000 mg | ORAL_TABLET | Freq: Every day | ORAL | 3 refills | Status: DC
Start: 2020-10-27 — End: 2021-08-13

## 2020-10-27 NOTE — Telephone Encounter (Signed)
Left message for patient to return our call to schedule an appointment.

## 2020-10-27 NOTE — Telephone Encounter (Signed)
Refill sent to pharmacy.   

## 2020-10-28 ENCOUNTER — Telehealth: Payer: Self-pay | Admitting: *Deleted

## 2020-10-28 NOTE — Telephone Encounter (Signed)
  Follow up Call-  Call back number 10/23/2020  Post procedure Call Back phone  # [phone]  Permission to leave phone message Yes  Some recent data might be hidden     Patient questions:  Do you have a fever, pain , or abdominal swelling? No. Pain Score  0 *  Have you tolerated food without any problems? Yes.    Have you been able to return to your normal activities? Yes.    Do you have any questions about your discharge instructions: Diet   No. Medications  No. Follow up visit  No.  Do you have questions or concerns about your Care? No.  Actions: * If pain score is 4 or above: No action needed, pain <4.  1. Have you developed a fever since your procedure? no  2.   Have you had an respiratory symptoms (SOB or cough) since your procedure? no  3.   Have you tested positive for COVID 19 since your procedure no  4.   Have you had any family members/close contacts diagnosed with the COVID 19 since your procedure?  no   If yes to any of these questions please route to Joylene John, RN and Joella Prince, RN

## 2020-10-31 ENCOUNTER — Encounter: Payer: Self-pay | Admitting: Gastroenterology

## 2020-12-17 ENCOUNTER — Ambulatory Visit: Payer: 59 | Admitting: Cardiology

## 2021-02-01 ENCOUNTER — Other Ambulatory Visit: Payer: Self-pay

## 2021-02-01 ENCOUNTER — Ambulatory Visit
Admission: RE | Admit: 2021-02-01 | Discharge: 2021-02-01 | Disposition: A | Discharge status: Home / Self Care | Payer: 59 | Source: Ambulatory Visit | Attending: Obstetrics and Gynecology | Admitting: Obstetrics and Gynecology

## 2021-02-01 DIAGNOSIS — N6489 Other specified disorders of breast: Secondary | ICD-10-CM

## 2021-02-26 ENCOUNTER — Encounter (HOSPITAL_BASED_OUTPATIENT_CLINIC_OR_DEPARTMENT_OTHER): Payer: Self-pay | Admitting: Cardiovascular Disease

## 2021-02-26 ENCOUNTER — Ambulatory Visit (INDEPENDENT_AMBULATORY_CARE_PROVIDER_SITE_OTHER): Payer: 59 | Admitting: Cardiovascular Disease

## 2021-02-26 ENCOUNTER — Other Ambulatory Visit: Payer: Self-pay

## 2021-02-26 VITALS — BP 118/62 | HR 99 | Ht 65.0 in | Wt 191.0 lb

## 2021-02-26 DIAGNOSIS — E782 Mixed hyperlipidemia: Secondary | ICD-10-CM

## 2021-02-26 DIAGNOSIS — I10 Essential (primary) hypertension: Secondary | ICD-10-CM

## 2021-02-26 DIAGNOSIS — R0602 Shortness of breath: Secondary | ICD-10-CM

## 2021-02-26 DIAGNOSIS — R000 Tachycardia, unspecified: Secondary | ICD-10-CM | POA: Diagnosis not present

## 2021-02-26 DIAGNOSIS — R609 Edema, unspecified: Secondary | ICD-10-CM

## 2021-02-26 HISTORY — DX: Shortness of breath: R06.02

## 2021-02-26 HISTORY — DX: Tachycardia, unspecified: R00.0

## 2021-02-26 NOTE — Progress Notes (Signed)
Cardiology Office Note:    Date:  02/26/2021   ID:  Judy Jones, DOB 1966-03-21, MRN [id number]  PCP:  Antony Contras, MD   [facility] Providers Cardiologist:  None     Referring MD: Antony Contras, MD   No chief complaint on file.   History of Present Illness:    Judy Jones is a 55 y.o. female with a hx of anxiety, arthritis, basal cell carcinoma (nose), hypertension, hyperlipidemia, diet-controlled diabetes mellitus, GERD, IBS, and hypertension here for follow-up.  She previously started seeing [facility] in 2021. Her primary focus was cardiovascular prevention. She has been working on weight loss and lost 7 pounds at her last appointment. She had a nuclear stress test 04/2020 with LVEF 80% and no ischemia.  Today, she is very anxious and concerned about her health after witnessing cardiovascular issues in her family. She reports having a spell of chest tightness and dyspnea last summer. Since then she has not had a similar episode of chest tightness and dyspnea. She now believes that she may have had a panic attack at that time. Lately, her HR is constantly elevated, even at rest. At night, she has recorded her HR in the 80s using her FitBit. At home, her blood pressure is usually better controlled. For her diet, she drinks 1 cup of coffee in the morning, and water for the rest of the day. She states she needs to be better at watching her salt intake. Most of her salt intake comes from processed foods. For activity, she is short of breath while climbing an incline but not while walking flat. However, she attributes this to deconditioning. Her activity is also limited by her right ankle. This past November 2021 she sprained her right ankle and noted significant LE edema. Her edema will dissipate after icing, elevating, and resting. Of note, she has tried therapy in the past but felt it did not help her. She denies any palpitations, headaches, lightheadedness, or syncope. Also  has no orthopnea or PND.   Past Medical History:  Diagnosis Date   ALLERGIC RHINITIS 05/30/2008   Qualifier: Diagnosis of  By: Chase Caller MD, Murali     Allergy    Anxiety    Arthritis    Cancer ([place]) 07/2018   Basal cell--nose   Cataract    removed both    Chest tightness 2/42/3536   Complication of anesthesia    Cough 05/30/2008   Qualifier: Diagnosis of  By: Chase Caller MD, Murali     Diet-controlled diabetes mellitus ([place]) 04/06/2020   Elevated hemoglobin A1c 2018   Essential hypertension 04/06/2020   Family history of anesthesia complication    Mother get sick on her stomach   Genuine stress incontinence, female 04/23/2014   GERD (gastroesophageal reflux disease)    Hypertension    IBS (irritable bowel syndrome)    Overweight 04/06/2020   PONV (postoperative nausea and vomiting)    Shortness of breath 02/26/2021   Tachycardia 02/26/2021   Wears glasses     Past Surgical History:  Procedure Laterality Date   BREAST BIOPSY Left 06/2019   CARPAL TUNNEL RELEASE Left 10/24/2014   Procedure: LEFT CARPAL TUNNEL ;  Surgeon: Roseanne Kaufman, MD;  Location: [facility];  Service: Orthopedics;  Laterality: Left;   CATARACT EXTRACTION, BILATERAL     CESAREAN SECTION  1985   CHOLECYSTECTOMY  1991   COLONOSCOPY     Hx: of   EYE SURGERY  2013   both cataracts  MOHS SURGERY  07/2018   nose--basal cell   right leg at knee   ULNAR NERVE TRANSPOSITION Right 08/01/2013   Procedure: RIGHT LIMITED CARPAL TUNNEL RELEASE,RIGHT CUBITAL TUNNEL RELEASE; right cubital tunnel release INSITU ,EXCISION RIGHT MIDDLE FINGER MASS;  Surgeon: Roseanne Kaufman, MD;  Location: [facility];  Service: Orthopedics;  Laterality: Right;   ULNAR NERVE TRANSPOSITION Left 10/24/2014   Procedure: ULNAR NERVE DECOMPRESSION AT ELBOW;  Surgeon: Roseanne Kaufman, MD;  Location: [facility];  Service: Orthopedics;  Laterality: Left;   VAGINAL HYSTERECTOMY  2007   ant/post repair   WISDOM TOOTH  EXTRACTION      Current Medications: Current Meds  Medication Sig   ALPRAZolam (XANAX) 0.25 MG tablet Take 0.25 mg by mouth daily as needed for anxiety.   betamethasone valerate ointment (VALISONE) 0.1 %    Cholecalciferol (VITAMIN D) 2000 UNITS CAPS Take 2,000 Units by mouth daily.   esomeprazole (NEXIUM) 40 MG capsule Take 40 mg by mouth every evening.   fluticasone (FLONASE) 50 MCG/ACT nasal spray Place 1 spray into both nostrils as needed.    loratadine (CLARITIN) 10 MG tablet Take 10 mg by mouth daily.   losartan-hydrochlorothiazide (HYZAAR) 100-12.5 MG per tablet Take 1 tablet by mouth daily.    Magnesium 250 MG TABS Take 250 mg by mouth at bedtime.   meloxicam (MOBIC) 7.5 MG tablet Take by mouth.   nitroGLYCERIN (NITROSTAT) 0.4 MG SL tablet Place 1 tablet (0.4 mg total) under the tongue every 5 (five) minutes as needed.   OVER THE COUNTER MEDICATION Take 5 mLs by mouth daily. "Leefy" liquid supplement with ginger, turmeric and pepper   PEG-KCl-NaCl-NaSulf-Na Asc-C (PLENVU) 140 g SOLR Take 1 kit by mouth as directed. Manufacturer's coupon Universal coupon code:BIN: P2366821; GROUP: [id number]; PCN: [id number]; ID: [id number]; PAY NO MORE $50; NO prior authorization   Current Facility-Administered Medications for the 02/26/21 encounter (Office Visit) with Skeet Latch, MD  Medication   0.9 %  sodium chloride infusion     Allergies:   Shellfish allergy, Clarithromycin, Flagyl [metronidazole], Flonase [fluticasone], Morphine, Other, Penicillins, and Percocet [oxycodone-acetaminophen]   Social History   Socioeconomic History   Marital status: Married    Spouse name: Not on file   Number of children: Not on file   Years of education: Not on file   Highest education level: Not on file  Occupational History   Not on file  Tobacco Use   Smoking status: Never   Smokeless tobacco: Never  Vaping Use   Vaping Use: Never used  Substance and Sexual Activity   Alcohol use: Yes     Comment: rarely   Drug use: No   Sexual activity: Yes    Partners: Male    Birth control/protection: Surgical    Comment: TVH--ovaries remain  Other Topics Concern   Not on file  Social History Narrative   Not on file   Social Determinants of Health   Financial Resource Strain: Not on file  Food Insecurity: Not on file  Transportation Needs: Not on file  Physical Activity: Not on file  Stress: Not on file  Social Connections: Not on file     Family History: The patient's family history includes Alzheimer's disease in her father; Atrial fibrillation in her mother and sister; Bipolar disorder in an other family member; Hypertension in her mother; Kidney disease in her mother; Stroke in her sister. There is no history of Colon cancer, Colon polyps, Esophageal cancer, Rectal cancer, or Stomach cancer.  ROS:   Please see the history of present illness.    (+) Stress (+) Anxiety (+) Exertional Shortness of breath (+) LE edema All other systems reviewed and are negative.  EKGs/Labs/Other Studies Reviewed:    The following studies were reviewed today:  Lexiscan Myoview 04/16/2020: Nuclear stress EF: 80%. The left ventricular ejection fraction is hyperdynamic (>65%). There was no ST segment deviation noted during stress. No T wave inversion was noted during stress. The study is normal. This is a low risk study.   EKG:   02/26/2021: Sinus rhythm. Rate 99 bpm.  Recent Labs: 06/17/2020: ALT 33; BUN 18; Creatinine, Ser 1.02; Hemoglobin 15.1; Platelets 224; Potassium 4.7; Sodium 137; TSH 2.220  Recent Lipid Panel    Component Value Date/Time   CHOL 162 06/17/2020 0939   TRIG 84 06/17/2020 0939   HDL 53 06/17/2020 0939   CHOLHDL 3.1 06/17/2020 0939   LDLCALC 93 06/17/2020 0939        Physical Exam:    VS:  BP 118/62 (BP Location: Left Arm, Patient Position: Sitting)   Pulse 99   Ht 5' 5" (1.651 m)   Wt 191 lb (86.6 kg)   LMP 01/13/2006   SpO2 99%   BMI 31.78 kg/m  ,  BMI Body mass index is 31.78 kg/m. GENERAL:  Well appearing HEENT: Pupils equal round and reactive, fundi not visualized, oral mucosa unremarkable NECK:  No jugular venous distention, waveform within normal limits, carotid upstroke brisk and symmetric, no bruits LUNGS:  Clear to auscultation bilaterally HEART:  RRR.  PMI not displaced or sustained,S1 and S2 within normal limits, no S3, no S4, no clicks, no rubs, no murmurs ABD:  Flat, positive bowel sounds normal in frequency in pitch, no bruits, no rebound, no guarding, no midline pulsatile mass, no hepatomegaly, no splenomegaly EXT:  2 plus pulses throughout, no edema, no cyanosis no clubbing SKIN:  No rashes no nodules NEURO:  Cranial nerves II through XII grossly intact, motor grossly intact throughout PSYCH:  Cognitively intact, oriented to person place and time   ASSESSMENT:    1. Swelling   2. Essential hypertension   3. Shortness of breath   4. Tachycardia   5. Mixed dyslipidemia    PLAN:   Essential hypertension BP was initially elevated but improved on repeat.  Continue losartan/HCTZ.  Encouraged increased exercise.   Shortness of breath Symptoms seem likely attributable to deconditioning.  However she does also have some mild lower extremity edema.  We will get an echo to assess for heart failure.  Tachycardia She is concerned about a family history of atrial fibrillation in her mother and her sister.  From what I can see she has only had sinus tachycardia.  She was very tachycardic to the 130s and 150s when she was seen in June.  In retrospect she thinks that this was attributable to a panic attack.  Her father recently passed and her sister developed A. fib and had a stroke when she decided not to take anticoagulation.  We discussed getting either an apple watch or a cardia mobile device.  She thinks that she will get the cardia device.  This will allow her to check her heart rate when it is fast to ensure she is not having  atrial fibrillation.  She has already had the appropriate testing and her thyroid is within normal limits, blood counts are normal, and electrolytes have been within normal limits.  I suspect this is mostly anxiety related.  She  is interested in talking with our care guide about stress management techniques.  Mixed dyslipidemia Continue rosuvastatin.    Disposition: Follow-up with Dontavia Brand C. Oval Linsey, MD, [facility] in 6 months.   Medication Adjustments/Labs and Tests Ordered: Current medicines are reviewed at length with the patient today.  Concerns regarding medicines are outlined above.  Orders Placed This Encounter  Procedures   EKG 12-Lead   ECHOCARDIOGRAM COMPLETE   No orders of the defined types were placed in this encounter.   Patient Instructions  Medication Instructions:  Your physician recommends that you continue on your current medications as directed. Please refer to the Current Medication list given to you today.   *If you need a refill on your cardiac medications before your next appointment, please call your pharmacy*  Lab Work: NONE   Testing/Procedures: Your physician has requested that you have an echocardiogram. Echocardiography is a painless test that uses sound waves to create images of your heart. It provides your doctor with information about the size and shape of your heart and how well your heart's chambers and valves are working. This procedure takes approximately one hour. There are no restrictions for this procedure.  [place] [address]   Follow-Up: At [facility], you and your health needs are our priority.  As part of our continuing mission to provide you with exceptional heart care, we have created designated Provider Care Teams.  These Care Teams include your primary Cardiologist (physician) and Advanced Practice Providers (APPs -  Physician Assistants and Nurse Practitioners) who all work together to provide you with the care  you need, when you need it.  We recommend signing up for the patient portal called "MyChart".  Sign up information is provided on this After Visit Summary.  MyChart is used to connect with patients for Virtual Visits (Telemedicine).  Patients are able to view lab/test results, encounter notes, upcoming appointments, etc.  Non-urgent messages can be sent to your provider as well.   To learn more about what you can do with MyChart, go to [email or web address].    Your next appointment:   6 month(s)  The format for your next appointment:   In Person  Provider:   Skeet Latch, MD or Laurann Montana, NP  Other Instructions  WILL HAVE AMY CARE GUIDE REACH OUT TO [place]    I,Mathew Stumpf,acting as a scribe for Skeet Latch, MD.,have documented all relevant documentation on the behalf of Skeet Latch, MD,as directed by  Skeet Latch, MD while in the presence of Skeet Latch, MD.  I, [place] Oval Linsey, MD have reviewed all documentation for this visit.  The documentation of the exam, diagnosis, procedures, and orders on 02/26/2021 are all accurate and complete.  Time spent: 40 minutes-Greater than 50% of this time was spent in counseling, explanation of diagnosis, planning of further management, and coordination of care.  Signed, Skeet Latch, MD  02/26/2021 5:52 PM    [place]

## 2021-02-26 NOTE — Assessment & Plan Note (Signed)
Continue rosuvastatin.  

## 2021-02-26 NOTE — Assessment & Plan Note (Signed)
BP was initially elevated but improved on repeat.  Continue losartan/HCTZ.  Encouraged increased exercise.

## 2021-02-26 NOTE — Patient Instructions (Addendum)
Medication Instructions:  Your physician recommends that you continue on your current medications as directed. Please refer to the Current Medication list given to you today.   *If you need a refill on your cardiac medications before your next appointment, please call your pharmacy*  Lab Work: NONE   Testing/Procedures: Your physician has requested that you have an echocardiogram. Echocardiography is a painless test that uses sound waves to create images of your heart. It provides your doctor with information about the size and shape of your heart and how well your heart's chambers and valves are working. This procedure takes approximately one hour. There are no restrictions for this procedure.  [place] [address]   Follow-Up: At [facility], you and your health needs are our priority.  As part of our continuing mission to provide you with exceptional heart care, we have created designated Provider Care Teams.  These Care Teams include your primary Cardiologist (physician) and Advanced Practice Providers (APPs -  Physician Assistants and Nurse Practitioners) who all work together to provide you with the care you need, when you need it.  We recommend signing up for the patient portal called "MyChart".  Sign up information is provided on this After Visit Summary.  MyChart is used to connect with patients for Virtual Visits (Telemedicine).  Patients are able to view lab/test results, encounter notes, upcoming appointments, etc.  Non-urgent messages can be sent to your provider as well.   To learn more about what you can do with MyChart, go to [email or web address].    Your next appointment:   6 month(s)  The format for your next appointment:   In Person  Provider:   Skeet Latch, MD or Laurann Montana, NP  Other Instructions  WILL HAVE AMY CARE GUIDE REACH OUT TO [place]

## 2021-02-26 NOTE — Assessment & Plan Note (Signed)
She is concerned about a family history of atrial fibrillation in her mother and her sister.  From what I can see she has only had sinus tachycardia.  She was very tachycardic to the 130s and 150s when she was seen in June.  In retrospect she thinks that this was attributable to a panic attack.  Her father recently passed and her sister developed A. fib and had a stroke when she decided not to take anticoagulation.  We discussed getting either an apple watch or a cardia mobile device.  She thinks that she will get the cardia device.  This will allow her to check her heart rate when it is fast to ensure she is not having atrial fibrillation.  She has already had the appropriate testing and her thyroid is within normal limits, blood counts are normal, and electrolytes have been within normal limits.  I suspect this is mostly anxiety related.  She is interested in talking with our care guide about stress management techniques.

## 2021-02-26 NOTE — Assessment & Plan Note (Signed)
Symptoms seem likely attributable to deconditioning.  However she does also have some mild lower extremity edema.  We will get an echo to assess for heart failure.

## 2021-03-02 ENCOUNTER — Encounter (HOSPITAL_BASED_OUTPATIENT_CLINIC_OR_DEPARTMENT_OTHER): Payer: Self-pay

## 2021-03-02 ENCOUNTER — Telehealth: Payer: Self-pay

## 2021-03-02 ENCOUNTER — Other Ambulatory Visit (HOSPITAL_COMMUNITY): Payer: 59

## 2021-03-02 DIAGNOSIS — Z0000 Encounter for general adult medical examination without abnormal findings: Secondary | ICD-10-CM

## 2021-03-02 NOTE — Telephone Encounter (Signed)
Called patient to discuss health coaching for stress management per referral from Dr. Oval Linsey. Patient is interested in health coaching and would like to be seen at [facility] on that day. Patient has been scheduled for 8/16 at 1:30pm. Patient was given Care Guide's contact information in case the patient needs to reschedule or reach out before then.

## 2021-03-04 ENCOUNTER — Encounter (HOSPITAL_BASED_OUTPATIENT_CLINIC_OR_DEPARTMENT_OTHER): Payer: Self-pay

## 2021-03-12 ENCOUNTER — Encounter (HOSPITAL_COMMUNITY): Payer: Self-pay

## 2021-03-15 ENCOUNTER — Other Ambulatory Visit (HOSPITAL_COMMUNITY): Payer: 59

## 2021-03-25 ENCOUNTER — Other Ambulatory Visit: Payer: Self-pay

## 2021-03-25 ENCOUNTER — Ambulatory Visit (HOSPITAL_COMMUNITY): Payer: 59 | Attending: Cardiology

## 2021-03-25 DIAGNOSIS — R0602 Shortness of breath: Secondary | ICD-10-CM | POA: Diagnosis present

## 2021-03-25 DIAGNOSIS — I10 Essential (primary) hypertension: Secondary | ICD-10-CM | POA: Diagnosis not present

## 2021-03-25 DIAGNOSIS — R000 Tachycardia, unspecified: Secondary | ICD-10-CM

## 2021-03-25 DIAGNOSIS — R609 Edema, unspecified: Secondary | ICD-10-CM

## 2021-03-25 LAB — ECHOCARDIOGRAM COMPLETE
Area-P 1/2: 4.41 cm2
S' Lateral: 2.8 cm

## 2021-03-30 ENCOUNTER — Other Ambulatory Visit: Payer: Self-pay

## 2021-03-30 ENCOUNTER — Encounter (HOSPITAL_BASED_OUTPATIENT_CLINIC_OR_DEPARTMENT_OTHER): Payer: Self-pay

## 2021-03-30 ENCOUNTER — Ambulatory Visit (INDEPENDENT_AMBULATORY_CARE_PROVIDER_SITE_OTHER): Payer: 59

## 2021-03-30 ENCOUNTER — Ambulatory Visit: Payer: Self-pay

## 2021-03-30 DIAGNOSIS — Z0000 Encounter for general adult medical examination without abnormal findings: Secondary | ICD-10-CM

## 2021-03-30 NOTE — Progress Notes (Signed)
Appointment Outcome:  Completed, Session #: Initial health coaching session Start time: 1:33pm   End time: 2:31pm   Total Mins: 58 minutes  AGREEMENTS SECTION   Overall Goal(s): Stress management                                              Progress Notes:  Patient is a wife and a mother of two daughters. Patient is currently working from home and will soon return to the office for two days a week starting Sept 6. Patient works from 8:30-5:00pm with no real breaks or lunchtime.   Patient shared several factors that she is dealing with that is causing her some anxiety/stress. Patient stated that it is interfering with her sleep as well. Patient mentioned that she has to process things before she can accept them.   Patient currently is in the process of building a home and there are some things that have caught her off guard with construction although this is an exciting time. Patient moved out of her 27-year home during the pandemic after the passing of her father.   Patient is concerned about her health outcomes with seeing the health of other loved ones decline. Patient thought that last year she was having a heart attack, but now believes it to have been a panic attack due to the amount of stress that she was under.   Patient mentioned that she spends a lot of time on social media. Patient typically has free time to herself after her husband goes to bed to incorporate me time.    Coaching Outcomes: Patient was provided a signed copy of the health coaching agreement and the Code of Ethics.   To combat negative self-talk the patient is willing to try positive self-talk after conducting self-check-ins to analyze how she is currently feeling and identify what she needs in that present moment.   Patient stated that she is entitled to breaks but never really take them but would start doing so. Patient mentioned going outside during this time.   Patient was provided ABCDE journal worksheets  to help start writing about her stress(ors) before going to bed to determine if it helps relax her mind enough so she can fall asleep without medication. Patient can also write about things that were successful for the day or she is grateful for. Patient will use this as a wind down time with no distractions.   Patient is interested in not looking at her phone after a certain time in the evening to help with going to sleep at night.   Patient shared that her husband is her supporter.   Agreement/Action Steps:  Take a work break at 10:00am (go outside for fresh air) Have lunch around 12:00pm-1:00pm (play some music or light a scented candle while eating) Take a work break at 3:00pm (go outside for fresh air) Conduct self-check-ins and implement positive self-talk Journal 2-3xs/week 10:00pm-10:30pm No phone after 10:00pm Utilize support system   Patient was emailed a copy her action steps for review for the next two weeks.

## 2021-04-06 ENCOUNTER — Ambulatory Visit: Payer: 59 | Admitting: Allergy and Immunology

## 2021-04-16 ENCOUNTER — Ambulatory Visit (INDEPENDENT_AMBULATORY_CARE_PROVIDER_SITE_OTHER): Payer: 59

## 2021-04-16 ENCOUNTER — Other Ambulatory Visit: Payer: Self-pay

## 2021-04-16 DIAGNOSIS — Z0000 Encounter for general adult medical examination without abnormal findings: Secondary | ICD-10-CM

## 2021-04-16 NOTE — Progress Notes (Signed)
[place]. No was selected in error.   Appointment Outcome: Completed, Session #: 1 Start time: 2:00pm   End time: 2:34pm  Total Mins: 34 minutes  AGREEMENTS SECTION   Overall Goal(s): Stress management   Agreement/Action Steps:  Take a work break at 10:00am (go outside for fresh air) Have lunch around 12:00pm-1:00pm (play some music or light a scented candle while eating) Take a work break at 3:00pm (go outside for fresh air) Conduct self-check-ins and implement positive self-talk Journal 2-3xs/week 10:00pm-10:30pm No phone after 10:00pm Utilize support system  Progress Notes:  Patient stated that she was able to take her breaks as schedule while she was working from home, but not in the office. Patient stated that it's harder with people around, having meetings, and work itself. Patient stated that being back in the office created more stress. Patient shared that she talks with other co-workers about how she was feeling and found that others were feeling overwhelmed too after being away from the office over the past two years. Patient stated that she was physically and mentally drained.  Patient stated that she must get adjusted to being back in the office. Patient also mentioned that they are still in the process of building a house and is trying to stay within budget, which is stressful.   Patient is feeling overwhelmed with the competing priorities she faces. Patient is having to provide support for her 10 year old mother, need to visit sister that had a stroke, and have work, home, and other responsibilities to tackle daily and doesn't feel like she is doing enough or can please everyone. Patient realized that she does not give herself credit for the things that she is able to accomplish daily. Patient realized that she hasn't taken the time to identify what makes her happy. Patient shared that she is struggling with her self-esteem.    Patient had conducted self-check-ins without realizing it. Patient identifies how she is feeling and process what she needs to do to reduce the stress she is currently feeling. Patient has written in her journal/notepad twice weekly. Patient stated that she writes about how she is feeling and what her stressors are and then throw it away. Patient was able to implement this step on a sleepless night, and after writing in her journal she was able to sleep. Patient would like to journal more often now.   Patient shared a moment at work where she was assertive and stood up for her recommendations. Patient stated that at times it is difficult to be assertive without offending others and patient internalize those feelings. Patient stated that trying to enforce healthy boundaries leads to friction in her relationships.   Patient husband is supporting her. Patient shared examples of times when he helped her reframe her negative self-talk. Patient stated that she is getting better at implementing positive self-talk. Patient stated that she is her worse critic. Patient stated that her husband is taking on tasks to help reduce some stress that she is experiencing.   Patient has not been able to break away from cell phone after 10:00pm. Patient finds herself engaging in mindless activities to get her mind off things.     Indicators of Success and Accountability:  Patient stated that implementing more positive self-talk and being assertive at work are her indicators of success with stress management.  Readiness: Patient is in the action phase of stress management.  Strengths and Supports: Patient's husband  is supporting her. Patient identified assertiveness and mindfulness as her strengths over the past two weeks.  Challenges and Barriers: Patient was challenged to take breaks at a specific time with the transition back into the office.    Coaching Outcomes: Patient stated that instead of taking set break at  10:00am and 3:00pm, she will take breaks from her work desk when she can to stand and go talk to co-workers. Patient stated that she will be able to take a lunch break in the office.   Patient will continue to practice being assertive and set healthy boundaries with others.   Patient will continue to write about her stressors and will end the entry with things that she is grateful for or was able to accomplish to help her reframe her negative thinking about herself. Patient will purchase her a journal dedicated to writing about her stress and feelings.   Patient will think of a reward to give herself for being able to go at least 2 days without her cell phone after 10:00pm. Patient stated that she will try to do more than 2 days if she can.   Patient will implement the following steps over the next two weeks.    Agreement/Action Steps:  Take a work break from desk when reasonable to talk with co-worker Have lunch around 12:00pm-1:00pm  Conduct self-check-ins and implement positive self-talk Journal 2-3xs/week 10:00pm-10:30pm No phone after 10:00pm 2xs/week Utilize support system Healthy boundaries/assertiveness  Attempted: Fulfilled - Patient is conducting self-check-ins and implementing positive self-talk, has written in journal twice each week, and is utilizing her support system daily. Partial - Patient was able to implement her breaks as outlined while working at home but had difficulty implementing them back in the office.  Not met - Patient was not able to go without her phone at set time.

## 2021-04-30 ENCOUNTER — Ambulatory Visit: Payer: 59

## 2021-05-11 ENCOUNTER — Ambulatory Visit: Payer: 59 | Admitting: Allergy and Immunology

## 2021-06-14 NOTE — Progress Notes (Signed)
55 y.o. G17P2002 Married Caucasian female here for annual exam.    Patient having sensitivity of the tongue. She has read this could be related to B12 deficiency and wants testing. She will be seeing allergist soon.   Saw cardiology for tachycardia.  No atrial fibrillation.  Had a normal stress test.   She is worried about MTHFR mutation.  Her daughter was diagnosed with this.  Her sister had a stroke and has significant debility.      Building a house.  Mom moving into senior living.   PCP:   Antony Contras, MD  Patient's last menstrual period was 01/13/2006.           Sexually active: Yes.    The current method of family planning is status post hysterectomy--ovaries remain.    Exercising: No.  The patient does not participate in regular exercise at present. Smoker:  no  Health Maintenance: Pap: 2012 History of abnormal Pap:  no MMG:  02-01-21 Lt.Diag/poss.distortion Lt.Br.is unchanged/Neg/return to annual screening/BiRads2 Colonoscopy: 10-23-20 normal:next 10 years BMD:   n/a  Result  n/a TDaP:  2014 Gardasil:   no HIV: Neg in the past Hep C: 05-13-16 Neg Screening Labs:  PCP. Flu vaccine:  done.  Covid vaccine:  2 booster.  Will do the bivalent.    reports that she has never smoked. She has never used smokeless tobacco. She reports that she does not currently use alcohol after a past usage of about 1.0 standard drink per week. She reports that she does not use drugs.  Past Medical History:  Diagnosis Date   ALLERGIC RHINITIS 05/30/2008   Qualifier: Diagnosis of  By: Chase Caller MD, Murali     Allergy    Anxiety    Arthritis    Cancer ([place]) 07/2018   Basal cell--nose   Cataract    removed both    Chest tightness 0/04/2329   Complication of anesthesia    Cough 05/30/2008   Qualifier: Diagnosis of  By: Chase Caller MD, Murali     Diet-controlled diabetes mellitus ([place]) 04/06/2020   Elevated hemoglobin A1c 2018   Essential hypertension 04/06/2020   Family history of  anesthesia complication    Mother get sick on her stomach   Genuine stress incontinence, female 04/23/2014   GERD (gastroesophageal reflux disease)    Hypertension    IBS (irritable bowel syndrome)    Overweight 04/06/2020   PONV (postoperative nausea and vomiting)    Shortness of breath 02/26/2021   Tachycardia 02/26/2021   Wears glasses     Past Surgical History:  Procedure Laterality Date   BREAST BIOPSY Left 06/2019   CARPAL TUNNEL RELEASE Left 10/24/2014   Procedure: LEFT CARPAL TUNNEL ;  Surgeon: Roseanne Kaufman, MD;  Location: [facility];  Service: Orthopedics;  Laterality: Left;   CATARACT EXTRACTION, BILATERAL     CESAREAN SECTION  1985   CHOLECYSTECTOMY  1991   COLONOSCOPY     Hx: of   EYE SURGERY  2013   both cataracts   MOHS SURGERY  07/2018   nose--basal cell   right leg at knee   ULNAR NERVE TRANSPOSITION Right 08/01/2013   Procedure: RIGHT LIMITED CARPAL TUNNEL RELEASE,RIGHT CUBITAL TUNNEL RELEASE; right cubital tunnel release INSITU ,EXCISION RIGHT MIDDLE FINGER MASS;  Surgeon: Roseanne Kaufman, MD;  Location: [place];  Service: Orthopedics;  Laterality: Right;   ULNAR NERVE TRANSPOSITION Left 10/24/2014   Procedure: ULNAR NERVE DECOMPRESSION AT ELBOW;  Surgeon: Roseanne Kaufman, MD;  Location: [facility];  Service: Orthopedics;  Laterality: Left;   VAGINAL HYSTERECTOMY  2007   ant/post repair   WISDOM TOOTH EXTRACTION      Current Outpatient Medications  Medication Sig Dispense Refill   ALPRAZolam (XANAX) 0.25 MG tablet Take 0.25 mg by mouth daily as needed for anxiety.     betamethasone valerate ointment (VALISONE) 0.1 %      Cholecalciferol (VITAMIN D) 2000 UNITS CAPS Take 2,000 Units by mouth daily.     esomeprazole (NEXIUM) 40 MG capsule Take 40 mg by mouth every evening.     fluticasone (FLONASE) 50 MCG/ACT nasal spray Place 1 spray into both nostrils as needed.      loratadine (CLARITIN) 10 MG tablet Take 10 mg by mouth daily.      losartan-hydrochlorothiazide (HYZAAR) 100-12.5 MG per tablet Take 1 tablet by mouth daily.      Magnesium 250 MG TABS Take 250 mg by mouth at bedtime.     ondansetron (ZOFRAN) 4 MG tablet Take 4 mg by mouth daily as needed.     nitroGLYCERIN (NITROSTAT) 0.4 MG SL tablet Place 1 tablet (0.4 mg total) under the tongue every 5 (five) minutes as needed. 25 tablet 6   rosuvastatin (CRESTOR) 5 MG tablet Take 1 tablet (5 mg total) by mouth daily. 90 tablet 3   Current Facility-Administered Medications  Medication Dose Route Frequency Provider Last Rate Last Admin   0.9 %  sodium chloride infusion  500 mL Intravenous Continuous Mansouraty, Telford Nab., MD        Family History  Problem Relation Age of Onset   Hypertension Mother    Atrial fibrillation Mother    Kidney disease Mother    Alzheimer's disease Father    Atrial fibrillation Sister    Stroke Sister    Bipolar disorder Other    Colon cancer Neg Hx    Colon polyps Neg Hx    Esophageal cancer Neg Hx    Rectal cancer Neg Hx    Stomach cancer Neg Hx     Review of Systems  All other systems reviewed and are negative.  Exam:   BP 122/64   Pulse (!) 104   Ht 5\' 4"  (1.626 m)   Wt 195 lb (88.5 kg)   LMP 01/13/2006   SpO2 97%   BMI 33.47 kg/m     General appearance: alert, cooperative and appears stated age Head: normocephalic, without obvious abnormality, atraumatic Neck: no adenopathy, supple, symmetrical, trachea midline and thyroid normal to inspection and palpation Lungs: clear to auscultation bilaterally Breasts: normal appearance, no masses or tenderness, No nipple retraction or dimpling, No nipple discharge or bleeding, No axillary adenopathy Heart: regular rate and rhythm Abdomen: soft, non-tender; no masses, no organomegaly Extremities: extremities normal, atraumatic, no cyanosis or edema Skin: skin color, texture, turgor normal. No rashes or lesions Lymph nodes: cervical, supraclavicular, and axillary nodes  normal. Neurologic: grossly normal  Pelvic: External genitalia:  no lesions              No abnormal inguinal nodes palpated.              Urethra:  normal appearing urethra with no masses, tenderness or lesions              Bartholins and Skenes: normal                 Vagina: normal appearing vagina with normal color and discharge, no lesions  Cervix: no lesions              Pap taken:  no Bimanual Exam:  Uterus:  normal size, contour, position, consistency, mobility, non-tender              Adnexa: no mass, fullness, tenderness              Rectal exam: yes.  Confirms.              Anus:  normal sphincter tone, no lesions  Chaperone was present for exam:  yes.  Assessment:   Well woman visit with gynecologic exam. Status post TVH and anterior and posterior colporrhaphy.  Ovaries remain. Prediabetic.  Mixed incontinence. Hx perianal fissures. Tongue pain.  FH MTHFR.  FH stroke.   Plan: Mammogram screening discussed. Self breast awareness reviewed. Pap and HR HPV not indicated.  Guidelines for Calcium, Vitamin D, regular exercise program including cardiovascular and weight bearing exercise. Will check vit B12 level.   I encouraged patient to contact her cardiologist to discuss her concerns about MTHFR and how to reduce her personal risk of stroke.  Follow up annually and prn.   After visit summary provided.

## 2021-06-15 ENCOUNTER — Encounter: Payer: Self-pay | Admitting: Obstetrics and Gynecology

## 2021-06-15 ENCOUNTER — Other Ambulatory Visit: Payer: Self-pay

## 2021-06-15 ENCOUNTER — Ambulatory Visit (INDEPENDENT_AMBULATORY_CARE_PROVIDER_SITE_OTHER): Payer: 59 | Admitting: Obstetrics and Gynecology

## 2021-06-15 VITALS — BP 122/64 | HR 104 | Ht 64.0 in | Wt 195.0 lb

## 2021-06-15 DIAGNOSIS — K146 Glossodynia: Secondary | ICD-10-CM | POA: Diagnosis not present

## 2021-06-15 DIAGNOSIS — Z01419 Encounter for gynecological examination (general) (routine) without abnormal findings: Secondary | ICD-10-CM | POA: Diagnosis not present

## 2021-06-16 LAB — VITAMIN B12: Vitamin B-12: 358 pg/mL (ref 200–1100)

## 2021-06-18 NOTE — Patient Instructions (Signed)

## 2021-07-13 ENCOUNTER — Ambulatory Visit (INDEPENDENT_AMBULATORY_CARE_PROVIDER_SITE_OTHER): Payer: 59 | Admitting: Allergy and Immunology

## 2021-07-13 ENCOUNTER — Other Ambulatory Visit: Payer: Self-pay

## 2021-07-13 VITALS — BP 110/60 | HR 96 | Temp 98.2°F | Resp 18 | Ht 65.0 in | Wt 199.4 lb

## 2021-07-13 DIAGNOSIS — H1013 Acute atopic conjunctivitis, bilateral: Secondary | ICD-10-CM

## 2021-07-13 DIAGNOSIS — K219 Gastro-esophageal reflux disease without esophagitis: Secondary | ICD-10-CM

## 2021-07-13 DIAGNOSIS — J3089 Other allergic rhinitis: Secondary | ICD-10-CM

## 2021-07-13 DIAGNOSIS — T781XXD Other adverse food reactions, not elsewhere classified, subsequent encounter: Secondary | ICD-10-CM

## 2021-07-13 DIAGNOSIS — T7840XA Allergy, unspecified, initial encounter: Secondary | ICD-10-CM | POA: Diagnosis not present

## 2021-07-13 DIAGNOSIS — J301 Allergic rhinitis due to pollen: Secondary | ICD-10-CM

## 2021-07-13 DIAGNOSIS — R000 Tachycardia, unspecified: Secondary | ICD-10-CM

## 2021-07-13 DIAGNOSIS — E538 Deficiency of other specified B group vitamins: Secondary | ICD-10-CM

## 2021-07-13 DIAGNOSIS — H1010 Acute atopic conjunctivitis, unspecified eye: Secondary | ICD-10-CM

## 2021-07-13 MED ORDER — RYALTRIS 665-25 MCG/ACT NA SUSP: 2.0000 | Freq: Two times a day (BID) | NASAL | 5 refills | Status: DC | PRN

## 2021-07-13 MED ORDER — CETIRIZINE HCL 10 MG PO TABS: 10.0000 mg | ORAL_TABLET | Freq: Two times a day (BID) | ORAL | 5 refills | Status: DC | PRN

## 2021-07-13 MED ORDER — EPINEPHRINE 0.3 MG/0.3ML IJ SOAJ: 0.3000 mg | Freq: Once | INTRAMUSCULAR | 1 refills | Status: AC

## 2021-07-13 MED ORDER — OLOPATADINE HCL 0.2 % OP SOLN: 1.0000 [drp] | OPHTHALMIC | 5 refills | Status: DC

## 2021-07-13 MED ORDER — MONTELUKAST SODIUM 10 MG PO TABS: 10.0000 mg | ORAL_TABLET | Freq: Every day | ORAL | 5 refills | Status: DC

## 2021-07-13 NOTE — Progress Notes (Signed)
[place] [facility] [place] [facility] [place]   Dear Dr. Moreen Fowler,  Thank you for referring Judy Jones to the [facility] on 07/13/2021.   Below is a summation of this patient's evaluation and recommendations.  Thank you for your referral. I will keep you informed about this patient's response to treatment.   If you have any questions please do not hesitate to contact me.   Sincerely,  Jiles Prows, MD Allergy / Immunology [place]   ______________________________________________________________________    NEW PATIENT NOTE  Referring Provider: Antony Contras, MD Primary Provider: Antony Contras, MD Date of office visit: 07/13/2021    Subjective:   Chief Complaint:  Judy Jones (DOB: 1965/08/25) is a 55 y.o. female who presents to the clinic on 07/13/2021 with a chief complaint of Allergic Reaction (Says she has had tongue swelling and irritation. She has had reactions to shrimp, lobster. ) .     HPI: Judy Jones presents to this clinic in evaluation of several issues.  Over the course of the past year she developed a "sore" tongue described as burning or itching when she eats specific foods especially citrus, pineapple, oranges, and tomato based sauces.  This will occur while eating these food and can sometimes last up to an hour even though she will take a Benadryl.  She never has any associated systemic or constitutional symptoms with this issue.  Her use of Benadryl earlier this year was almost on a daily basis but now she just utilizes this agent a few times per week to address this tongue issue.  She also has a long history of problems with nasal congestion sneezing and itchy red watery eyes especially following exposure to grass and dust and dog and cat.  She uses Flonase most days of the week to treat this issue and also to treat her eustachian tube  dysfunction on the right.  She has seen ENT regarding her eustachian tube dysfunction about 2 years ago.  Most of her upper airway and nasal symptoms occur on a perennial basis but definitely flare during the spring and her worst season of the year as the fall.  She does have a history of reflux for which she takes Nexium which works very well.  She has 1 cup of coffee and intermittently drinks some tea per day and eats chocolate about 4 times per week.  She also has seen a cardiologist about tachycardia and no specific etiologic agent apparently could be identified giving rise to that issue.  Past Medical History:  Diagnosis Date   ALLERGIC RHINITIS 05/30/2008   Qualifier: Diagnosis of  By: Chase Caller MD, Murali     Allergy    Anxiety    Arthritis    Cancer ([place]) 07/2018   Basal cell--nose   Cataract    removed both    Chest tightness 5/88/5027   Complication of anesthesia    Cough 05/30/2008   Qualifier: Diagnosis of  By: Chase Caller MD, Murali     Diet-controlled diabetes mellitus ([place]) 04/06/2020   Elevated hemoglobin A1c 2018   Essential hypertension 04/06/2020   Family history of anesthesia complication    Mother get sick on her stomach   Genuine stress incontinence, female 04/23/2014   GERD (gastroesophageal reflux disease)    Hypertension    IBS (irritable bowel syndrome)    Overweight 04/06/2020   PONV (postoperative nausea and vomiting)  Shortness of breath 02/26/2021   Tachycardia 02/26/2021   Wears glasses     Past Surgical History:  Procedure Laterality Date   BREAST BIOPSY Left 06/2019   CARPAL TUNNEL RELEASE Left 10/24/2014   Procedure: LEFT CARPAL TUNNEL ;  Surgeon: Roseanne Kaufman, MD;  Location: [facility];  Service: Orthopedics;  Laterality: Left;   CATARACT EXTRACTION, BILATERAL     CESAREAN SECTION  1985   CHOLECYSTECTOMY  1991   COLONOSCOPY     Hx: of   EYE SURGERY  2013   both cataracts   MOHS SURGERY  07/2018   nose--basal cell   right  leg at knee   ULNAR NERVE TRANSPOSITION Right 08/01/2013   Procedure: RIGHT LIMITED CARPAL TUNNEL RELEASE,RIGHT CUBITAL TUNNEL RELEASE; right cubital tunnel release INSITU ,EXCISION RIGHT MIDDLE FINGER MASS;  Surgeon: Roseanne Kaufman, MD;  Location: [facility];  Service: Orthopedics;  Laterality: Right;   ULNAR NERVE TRANSPOSITION Left 10/24/2014   Procedure: ULNAR NERVE DECOMPRESSION AT ELBOW;  Surgeon: Roseanne Kaufman, MD;  Location: [facility];  Service: Orthopedics;  Laterality: Left;   VAGINAL HYSTERECTOMY  2007   ant/post repair   WISDOM TOOTH EXTRACTION      Allergies as of 07/13/2021       Reactions   Shellfish Allergy Anaphylaxis   Shrimp (diagnostic) Swelling   Other reaction(s): tongue swelling   Morphine Hives   Morphine Sulfate    Other reaction(s): rash   Other Cough   Pet dander-coughing, runny nose, itchy eyes   Penicillin V    Other reaction(s): rash   Penicillins Other (See Comments)   Childhood reaction: possibly hives    Percocet [oxycodone-acetaminophen] Nausea And Vomiting   Clarithromycin Nausea And Vomiting   Other reaction(s): gi upset   Flagyl [metronidazole] Nausea Only   Per patient report        Medication List    ALPRAZolam 0.25 MG tablet Commonly known as: XANAX Take 0.25 mg by mouth daily as needed for anxiety.   betamethasone valerate ointment 0.1 % Commonly known as: VALISONE   esomeprazole 40 MG capsule Commonly known as: NEXIUM Take 40 mg by mouth every evening.   fluticasone 50 MCG/ACT nasal spray Commonly known as: FLONASE Place 1 spray into both nostrils as needed.   loratadine 10 MG tablet Commonly known as: CLARITIN Take 10 mg by mouth daily.   losartan-hydrochlorothiazide 100-12.5 MG tablet Commonly known as: HYZAAR Take 1 tablet by mouth daily.   Magnesium 250 MG Tabs Take 250 mg by mouth at bedtime.   ondansetron 4 MG tablet Commonly known as: ZOFRAN Take 4 mg by mouth daily as needed.   rosuvastatin  5 MG tablet Commonly known as: CRESTOR Take 1 tablet (5 mg total) by mouth daily.   Vitamin D 50 MCG (2000 UT) Caps Take 2,000 Units by mouth daily.    Review of systems negative except as noted in HPI / PMHx or noted below:  Review of Systems  Constitutional: Negative.   HENT: Negative.    Eyes: Negative.   Respiratory: Negative.    Cardiovascular: Negative.   Gastrointestinal: Negative.   Genitourinary: Negative.   Musculoskeletal: Negative.   Skin: Negative.   Neurological: Negative.   Endo/Heme/Allergies: Negative.   Psychiatric/Behavioral: Negative.     Family History  Problem Relation Age of Onset   Hypertension Mother    Atrial fibrillation Mother    Kidney disease Mother    Alzheimer's disease Father    Atrial fibrillation Sister  Stroke Sister    Bipolar disorder Other    Colon cancer Neg Hx    Colon polyps Neg Hx    Esophageal cancer Neg Hx    Rectal cancer Neg Hx    Stomach cancer Neg Hx     Social History   Socioeconomic History   Marital status: Married    Spouse name: Not on file   Number of children: Not on file   Years of education: Not on file   Highest education level: Not on file  Occupational History   Not on file  Tobacco Use   Smoking status: Never   Smokeless tobacco: Never  Vaping Use   Vaping Use: Never used  Substance and Sexual Activity   Alcohol use: Not Currently    Alcohol/week: 1.0 standard drink    Types: 1 Cans of beer per week    Comment: rarely   Drug use: No   Sexual activity: Yes    Partners: Male    Birth control/protection: Surgical    Comment: TVH--ovaries remain  Other Topics Concern   Not on file  Social History Narrative   Not on file   Environmental and Social history  Lives in a house with a dry environment, dog look inside the household, carpet in the bedroom, no plastic on the bed, no plastic on the pillow, no smoking ongoing with inside the household.  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  Objective:   Vitals:   07/13/21 1440  BP: 110/60  Pulse: 96  Resp: 18  Temp: 98.2 F (36.8 C)  SpO2: 98%   Height: 5\' 5"  (165.1 cm) Weight: 199 lb 6.4 oz (90.4 kg)  Physical Exam Constitutional:      Appearance: She is not diaphoretic.  HENT:     Head: Normocephalic.     Right Ear: Tympanic membrane, ear canal and external ear normal.     Left Ear: Tympanic membrane, ear canal and external ear normal.     Nose: Nose normal. No mucosal edema or rhinorrhea.     Mouth/Throat:     Pharynx: Uvula midline. No oropharyngeal exudate.  Eyes:     Conjunctiva/sclera: Conjunctivae normal.  Neck:     Thyroid: No thyromegaly.     Trachea: Trachea normal. No tracheal tenderness or tracheal deviation.  Cardiovascular:     Rate and Rhythm: Normal rate and regular rhythm.     Heart sounds: S1 normal and S2 normal. Murmur (Early short duration very mild flow murmur) heard.  Pulmonary:     Effort: No respiratory distress.     Breath sounds: Normal breath sounds. No stridor. No wheezing or rales.  Lymphadenopathy:     Head:     Right side of head: No tonsillar adenopathy.     Left side of head: No tonsillar adenopathy.     Cervical: No cervical adenopathy.  Skin:    Findings: No erythema or rash.     Nails: There is no clubbing.  Neurological:     Mental Status: She is alert.    Diagnostics: Allergy skin tests were performed.  She demonstrated hypersensitivity against trees, grasses, weeds, cat, horse, and slight hypersensitivity against dog  Results of blood tests obtained 15 June 2021 identifies B12 358 PG/mL  Results of blood tests obtained 17 June 2020 identified WBC 7.9, absolute eosinophil 300, absolute lymphocyte 2300, hemoglobin 15.1, platelet 224  Assessment and Plan:    1. Perennial allergic rhinitis   2. Seasonal  allergic rhinitis due to pollen   3. Seasonal allergic conjunctivitis   4. Pollen-food allergy, subsequent encounter   5.  Gastroesophageal reflux disease, unspecified whether esophagitis present   6. Tachycardia   7. Low serum vitamin B12     1.  Allergen avoidance measures - pollens, cat, dog, horse  2.  Treat and prevent inflammation:  A. Ryaltris - 2 sprays each nostril 1-2 time per day (specialty pharmacy) B. Montelukast 10 mg - 1 tablet 1 time per day  3.  If needed:  A. Antihistamine - loratadine / cetirizine 10 mg daily B. Pataday - 1 drop each eye 1 time per day C. Epi-Pen, benadryl, MD/ER evaluation for allergic reaction  4.  Consider consolidating caffeine/chocolate consumption to help with reflux and tachycardia.  5.  Consider undergoing progressive aerobic exercise program to help with tachycardia  6.  Can consider starting a course of immunotherapy to address allergic disease  7.  Use a multivitamin supplement containing both B12 and folate for least the next 6 months  8. Return to clinic in 12 weeks or earlier if problem  Natoshia appears to have significant atopic disease giving rise to inflammation of her upper airway and as well oral allergy syndrome.  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  She will use a combination of allergen avoidance measures and anti-inflammatory agents for her airway as noted above.  There is also some issues with reflux and I have made some suggestions about how to handle that issue and she has a history of tachycardia and I have also made some suggestions about that issue as well.  She would definitely be a candidate for immunotherapy to address both her allergic rhinoconjunctivitis and oral allergy syndrome and we have given her literature on this form of treatment.   Finally, she does have a low normal B12 level and I have asked her to to use a multivitamin that contains both B12 and folate just in case she is developing some degree of glossitis as a result of these low normal B12 levels.  I will see her back in his clinic in 12 weeks or earlier if there is a problem.  Jiles Prows, MD Allergy / Immunology [facility]

## 2021-07-13 NOTE — Patient Instructions (Addendum)
  1.  Allergen avoidance measures - pollens, cat, dog, horse  2.  Treat and prevent inflammation:  A. Ryaltris - 2 sprays each nostril 1-2 time per day (specialty pharmacy) B. Montelukast 10 mg - 1 tablet 1 time per day  3.  If needed:  A. Antihistamine - loratadine / cetirizine 10 mg daily B. Pataday - 1 drop each eye 1 time per day C. Epi-Pen, benadryl, MD/ER evaluation for allergic reaction  4.  Consider consolidating caffeine/chocolate consumption to help with reflux and tachycardia.  5.  Consider undergoing progressive aerobic exercise program to help with tachycardia  6.  Can consider starting a course of immunotherapy to address allergic disease  7.  Use a multivitamin supplement containing both B12 and folate for least the next 6 months  8. Return to clinic in 12 weeks or earlier if problem

## 2021-07-14 ENCOUNTER — Encounter: Payer: Self-pay | Admitting: Allergy and Immunology

## 2021-08-13 ENCOUNTER — Ambulatory Visit (INDEPENDENT_AMBULATORY_CARE_PROVIDER_SITE_OTHER): Payer: 59 | Admitting: Cardiovascular Disease

## 2021-08-13 ENCOUNTER — Other Ambulatory Visit: Payer: Self-pay

## 2021-08-13 ENCOUNTER — Encounter (HOSPITAL_BASED_OUTPATIENT_CLINIC_OR_DEPARTMENT_OTHER): Payer: Self-pay | Admitting: Cardiovascular Disease

## 2021-08-13 DIAGNOSIS — I10 Essential (primary) hypertension: Secondary | ICD-10-CM | POA: Diagnosis not present

## 2021-08-13 DIAGNOSIS — E782 Mixed hyperlipidemia: Secondary | ICD-10-CM | POA: Diagnosis not present

## 2021-08-13 DIAGNOSIS — R000 Tachycardia, unspecified: Secondary | ICD-10-CM | POA: Diagnosis not present

## 2021-08-13 MED ORDER — ROSUVASTATIN CALCIUM 5 MG PO TABS: 5.0000 mg | ORAL_TABLET | Freq: Every day | ORAL | 3 refills | Status: DC

## 2021-08-13 NOTE — Progress Notes (Signed)
Cardiology Office Note:    Date:  08/13/2021   ID:  Judy Jones, DOB Mar 03, 1966, MRN [id number]  PCP:  Antony Contras, MD   [facility] Providers Cardiologist:  None     Referring MD: Antony Contras, MD   No chief complaint on file.    History of Present Illness:    Judy Jones is a 55 y.o. female with a hx of anxiety, arthritis, basal cell carcinoma (nose), hypertension, hyperlipidemia, diet-controlled diabetes mellitus, GERD, IBS, and hypertension here for follow-up.   She previously started seeing [facility] in 2021. Her primary focus was cardiovascular prevention. She has been working on weight loss and lost 7 pounds at her last appointment. She had a nuclear stress test 04/2020 with LVEF 80% and no ischemia.  She was last seen 02/2021 and was very anxious and concerned about her health after witnessing cardiovascular issues in her family. She reported having a spell of chest tightness and dyspnea the preceding summer. She also noted that her heart rate was elevated on her FitBit but the episodes were sporadic.  She dedied to get an Apple watch.  She had exertional dyspnea and got an echo that revealed LVEF greater than 75% with grade 1 diastolic dysfunction.  It was otherwise unremarkable.  Lately she has been feeling better.  She had a lot of stressors that have improved.  Her mom was moved into an assisted living facility.  Her sister who had a stroke is now living with her daughter and settled.  Last year she was building a home and just closed on it yesterday.  Her palpitations have improved significantly.  She has no chest pain or shortness of breath.  She denies lower extremity edema, orthopnea, or PND.  She still worries that she might have a problem with her heart in the future.  She has otherwise been feeling well.  She now notes that the times when her heart was racing she think she was actually having a panic attack.   Past Medical History:  Diagnosis Date    ALLERGIC RHINITIS 05/30/2008   Qualifier: Diagnosis of  By: Chase Caller MD, Murali     Allergy    Anxiety    Arthritis    Cancer ([place]) 07/2018   Basal cell--nose   Cataract    removed both    Chest tightness 9/56/3875   Complication of anesthesia    Cough 05/30/2008   Qualifier: Diagnosis of  By: Chase Caller MD, Murali     Diet-controlled diabetes mellitus ([place]) 04/06/2020   Elevated hemoglobin A1c 2018   Essential hypertension 04/06/2020   Family history of anesthesia complication    Mother get sick on her stomach   Genuine stress incontinence, female 04/23/2014   GERD (gastroesophageal reflux disease)    Hypertension    IBS (irritable bowel syndrome)    Overweight 04/06/2020   PONV (postoperative nausea and vomiting)    Shortness of breath 02/26/2021   Tachycardia 02/26/2021   Wears glasses     Past Surgical History:  Procedure Laterality Date   BREAST BIOPSY Left 06/2019   CARPAL TUNNEL RELEASE Left 10/24/2014   Procedure: LEFT CARPAL TUNNEL ;  Surgeon: Roseanne Kaufman, MD;  Location: [facility];  Service: Orthopedics;  Laterality: Left;   CATARACT EXTRACTION, BILATERAL     CESAREAN SECTION  1985   CHOLECYSTECTOMY  1991   COLONOSCOPY     Hx: of   EYE SURGERY  2013   both cataracts   MOHS SURGERY  07/2018   nose--basal cell   right leg at knee   ULNAR NERVE TRANSPOSITION Right 08/01/2013   Procedure: RIGHT LIMITED CARPAL TUNNEL RELEASE,RIGHT CUBITAL TUNNEL RELEASE; right cubital tunnel release INSITU ,EXCISION RIGHT MIDDLE FINGER MASS;  Surgeon: Roseanne Kaufman, MD;  Location: [facility];  Service: Orthopedics;  Laterality: Right;   ULNAR NERVE TRANSPOSITION Left 10/24/2014   Procedure: ULNAR NERVE DECOMPRESSION AT ELBOW;  Surgeon: Roseanne Kaufman, MD;  Location: [facility];  Service: Orthopedics;  Laterality: Left;   VAGINAL HYSTERECTOMY  2007   ant/post repair   WISDOM TOOTH EXTRACTION      Current Medications: Current Meds  Medication Sig    ALPRAZolam (XANAX) 0.25 MG tablet Take 0.25 mg by mouth daily as needed for anxiety.   betamethasone valerate ointment (VALISONE) 0.1 % Apply 1 application topically daily.   cetirizine (ZYRTEC) 10 MG tablet Take 1 tablet (10 mg total) by mouth 2 (two) times daily as needed for allergies (Can use an extra dose during flares).   Cholecalciferol (VITAMIN D) 2000 UNITS CAPS Take 2,000 Units by mouth daily.   esomeprazole (NEXIUM) 40 MG capsule Take 40 mg by mouth every evening.   fluticasone (FLONASE) 50 MCG/ACT nasal spray Place 1 spray into both nostrils as needed.    loratadine (CLARITIN) 10 MG tablet Take 10 mg by mouth daily.   losartan-hydrochlorothiazide (HYZAAR) 100-12.5 MG per tablet Take 1 tablet by mouth daily.    Magnesium 250 MG TABS Take 250 mg by mouth at bedtime.   montelukast (SINGULAIR) 10 MG tablet Take 1 tablet (10 mg total) by mouth at bedtime.   Olopatadine HCl (PATADAY) 0.2 % SOLN Place 1 drop into both eyes 1 day or 1 dose.   Olopatadine-Mometasone (RYALTRIS) [id number] MCG/ACT SUSP Place 2 sprays into the nose 2 (two) times daily as needed.   ondansetron (ZOFRAN) 4 MG tablet Take 4 mg by mouth daily as needed for nausea or vomiting.   [DISCONTINUED] rosuvastatin (CRESTOR) 5 MG tablet Take 1 tablet (5 mg total) by mouth daily.   Current Facility-Administered Medications for the 08/13/21 encounter (Office Visit) with Skeet Latch, MD  Medication   0.9 %  sodium chloride infusion     Allergies:   Shellfish allergy, Shrimp (diagnostic), Morphine, Morphine sulfate, Other, Penicillin v, Penicillins, Percocet [oxycodone-acetaminophen], Clarithromycin, and Flagyl [metronidazole]   Social History   Socioeconomic History   Marital status: Married    Spouse name: Not on file   Number of children: Not on file   Years of education: Not on file   Highest education level: Not on file  Occupational History   Not on file  Tobacco Use   Smoking status: Never   Smokeless  tobacco: Never  Vaping Use   Vaping Use: Never used  Substance and Sexual Activity   Alcohol use: Not Currently    Alcohol/week: 1.0 standard drink    Types: 1 Cans of beer per week    Comment: rarely   Drug use: No   Sexual activity: Yes    Partners: Male    Birth control/protection: Surgical    Comment: TVH--ovaries remain  Other Topics Concern   Not on file  Social History Narrative   Not on file   Social Determinants of Health   Financial Resource Strain: Not on file  Food Insecurity: Not on file  Transportation Needs: Not on file  Physical Activity: Not on file  Stress: Not on file  Social Connections: Not on file  Family History: The patient's family history includes Alzheimer's disease in her father; Atrial fibrillation in her mother and sister; Bipolar disorder in an other family member; Hypertension in her mother; Kidney disease in her mother; Stroke in her sister. There is no history of Colon cancer, Colon polyps, Esophageal cancer, Rectal cancer, or Stomach cancer.  ROS:   Please see the history of present illness.    (+) Stress (+) Anxiety (+) Exertional Shortness of breath (+) LE edema All other systems reviewed and are negative.  EKGs/Labs/Other Studies Reviewed:    The following studies were reviewed today:  Lexiscan Myoview 04/16/2020: Nuclear stress EF: 80%. The left ventricular ejection fraction is hyperdynamic (>65%). There was no ST segment deviation noted during stress. No T wave inversion was noted during stress. The study is normal. This is a low risk study.  Echo 03/2021: IMPRESSIONS    1. Left ventricular ejection fraction, by estimation, is >75%. The left  ventricle has hyperdynamic function. The left ventricle has no regional  wall motion abnormalities. Left ventricular diastolic parameters are  consistent with Grade I diastolic  dysfunction (impaired relaxation). The average left ventricular global  longitudinal strain is -18.9 %.  The global longitudinal strain is normal.   2. Right ventricular systolic function is normal. The right ventricular  size is normal.   3. The mitral valve is normal in structure. No evidence of mitral valve  regurgitation. No evidence of mitral stenosis.   4. The aortic valve is tricuspid. Aortic valve regurgitation is not  visualized. No aortic stenosis is present.   5. The inferior vena cava is normal in size with greater than 50%  respiratory variability, suggesting right atrial pressure of 3 mmHg.  EKG:   02/26/2021: Sinus rhythm. Rate 99 bpm.  Recent Labs: No results found for requested labs within last 8760 hours.  Recent Lipid Panel    Component Value Date/Time   CHOL 162 06/17/2020 0939   TRIG 84 06/17/2020 0939   HDL 53 06/17/2020 0939   CHOLHDL 3.1 06/17/2020 0939   LDLCALC 93 06/17/2020 0939        Physical Exam:    VS:  BP 122/80    Pulse 88    Ht 5\' 5"  (1.651 m)    Wt 194 lb 9.6 oz (88.3 kg)    LMP 01/13/2006    SpO2 97%    BMI 32.38 kg/m  , BMI Body mass index is 32.38 kg/m. GENERAL:  Well appearing HEENT: Pupils equal round and reactive, fundi not visualized, oral mucosa unremarkable NECK:  No jugular venous distention, waveform within normal limits, carotid upstroke brisk and symmetric, no bruits, no thyromegaly LUNGS:  Clear to auscultation bilaterally HEART:  RRR.  PMI not displaced or sustained,S1 and S2 within normal limits, no S3, no S4, no clicks, no rubs, no murmurs ABD:  Flat, positive bowel sounds normal in frequency in pitch, no bruits, no rebound, no guarding, no midline pulsatile mass, no hepatomegaly, no splenomegaly EXT:  2 plus pulses throughout, no edema, no cyanosis no clubbing SKIN:  No rashes no nodules NEURO:  Cranial nerves II through XII grossly intact, motor grossly intact throughout PSYCH:  Cognitively intact, oriented to person place and time   ASSESSMENT:    1. Primary hypertension   2. Mixed dyslipidemia   3. Tachycardia      PLAN:   Hypertension Blood pressures well-controlled on losartan and HCTZ.  Continue current regimen and recommended that she increase her exercise to least 150 minutes  weekly.  Mixed dyslipidemia Lipids are well have been controlled on rosuvastatin.  Increase exercise and continue to work on diet.  Tachycardia Her tachycardia and palpitations have resolved since she closed on her house and other life stressors have improved.  Continue to monitor.  It seems to be stress related.    Disposition: Follow-up with Ramiz Turpin C. Oval Linsey, MD, [facility] in 1 year  Medication Adjustments/Labs and Tests Ordered: Current medicines are reviewed at length with the patient today.  Concerns regarding medicines are outlined above.  No orders of the defined types were placed in this encounter.  Meds ordered this encounter  Medications   rosuvastatin (CRESTOR) 5 MG tablet    Sig: Take 1 tablet (5 mg total) by mouth daily.    Dispense:  90 tablet    Refill:  3     Patient Instructions  Medication Instructions:  No Changes In Medications at this time.  *If you need a refill on your cardiac medications before your next appointment, please call your pharmacy*  Follow-Up: At [facility], you and your health needs are our priority.  As part of our continuing mission to provide you with exceptional heart care, we have created designated Provider Care Teams.  These Care Teams include your primary Cardiologist (physician) and Advanced Practice Providers (APPs -  Physician Assistants and Nurse Practitioners) who all work together to provide you with the care you need, when you need it.  Your next appointment:   1 year(s)  The format for your next appointment:   In Person  Provider:   Skeet Latch, MD    Signed, Skeet Latch, MD  08/13/2021 9:52 AM    [place]

## 2021-08-13 NOTE — Assessment & Plan Note (Signed)
Lipids are well have been controlled on rosuvastatin.  Increase exercise and continue to work on diet.

## 2021-08-13 NOTE — Assessment & Plan Note (Signed)
Blood pressures well-controlled on losartan and HCTZ.  Continue current regimen and recommended that she increase her exercise to least 150 minutes weekly.

## 2021-08-13 NOTE — Patient Instructions (Signed)
Medication Instructions:  No Changes In Medications at this time.  *If you need a refill on your cardiac medications before your next appointment, please call your pharmacy*  Follow-Up: At [facility], you and your health needs are our priority.  As part of our continuing mission to provide you with exceptional heart care, we have created designated Provider Care Teams.  These Care Teams include your primary Cardiologist (physician) and Advanced Practice Providers (APPs -  Physician Assistants and Nurse Practitioners) who all work together to provide you with the care you need, when you need it.  Your next appointment:   1 year(s)  The format for your next appointment:   In Person  Provider:   Skeet Latch, MD

## 2021-08-13 NOTE — Assessment & Plan Note (Signed)
Her tachycardia and palpitations have resolved since she closed on her house and other life stressors have improved.  Continue to monitor.  It seems to be stress related.

## 2021-10-13 IMAGING — MG MM DIGITAL DIAGNOSTIC UNILAT*L* W/ TOMO W/ CAD
8 series · 8 of 24 positions shown · non-contrast
Comparison: Previous exam(s).

CLINICAL DATA: 54-year-old female presenting for six-month
follow-up status post benign stereotactic biopsy of possible left
breast distortion. Final pathology demonstrated SCLEROSING ADENOSIS
AND FIBROCYSTIC CHANGES WITH CALCIFICATIONS which was found to be
concordant.

EXAM:
DIGITAL DIAGNOSTIC UNILATERAL LEFT MAMMOGRAM WITH CAD AND TOMO

[L CC synth-2D (1 of 2)]
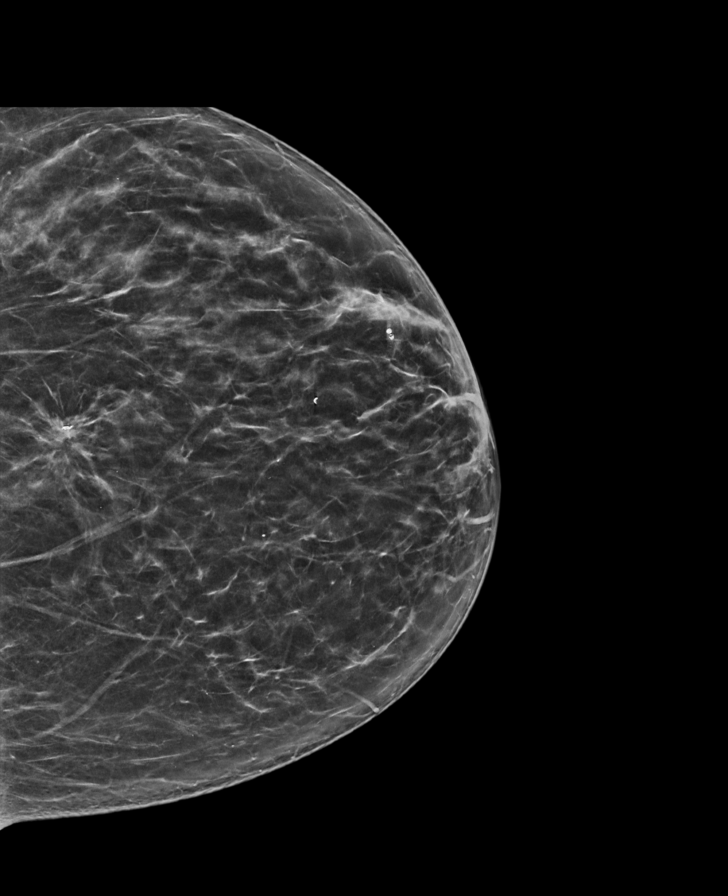

[L CC synth-2D (2 of 2)]
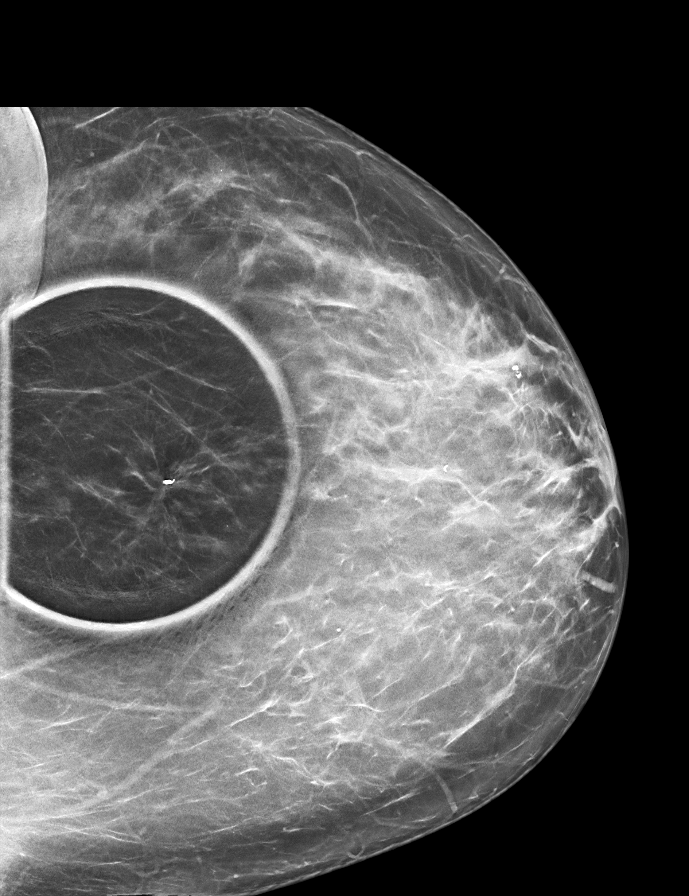

[L MLO synth-2D (1 of 2)]
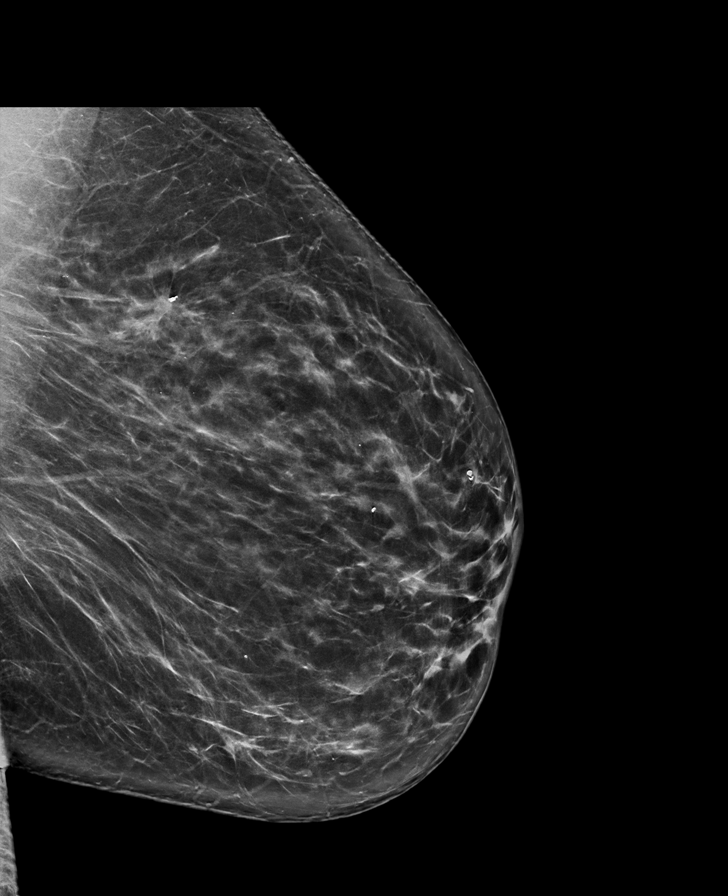

[L MLO synth-2D (2 of 2)]
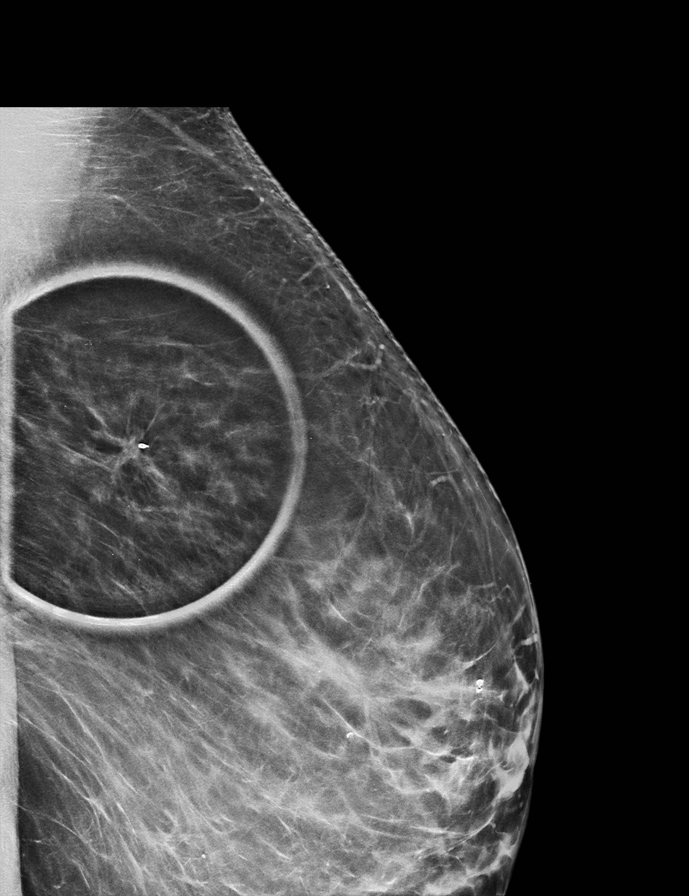

[L CC tomo (1 of 2) · tomo slice 35/70.0]
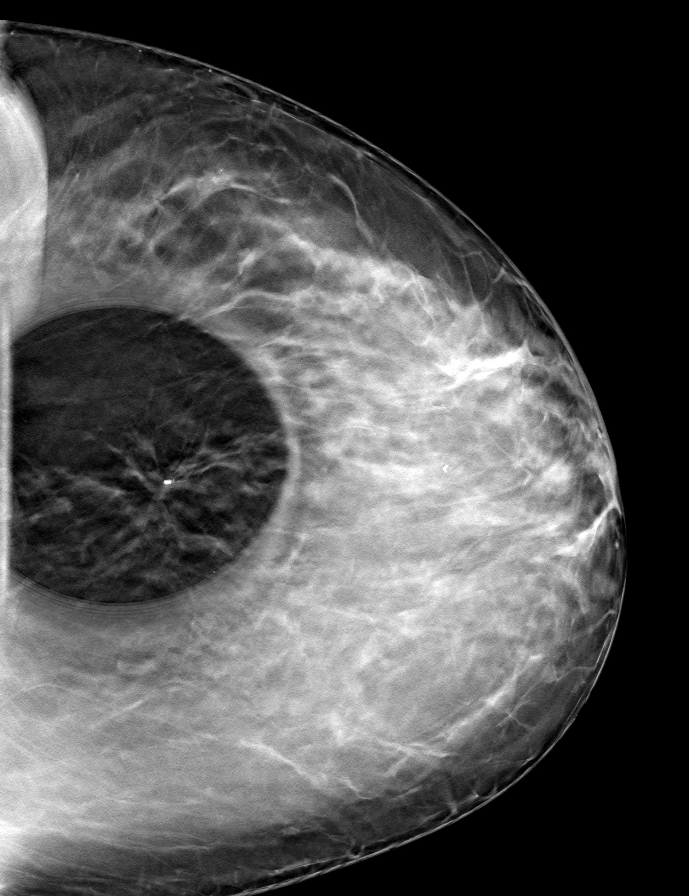

[L CC tomo (2 of 2) · tomo slice 39/76.0]
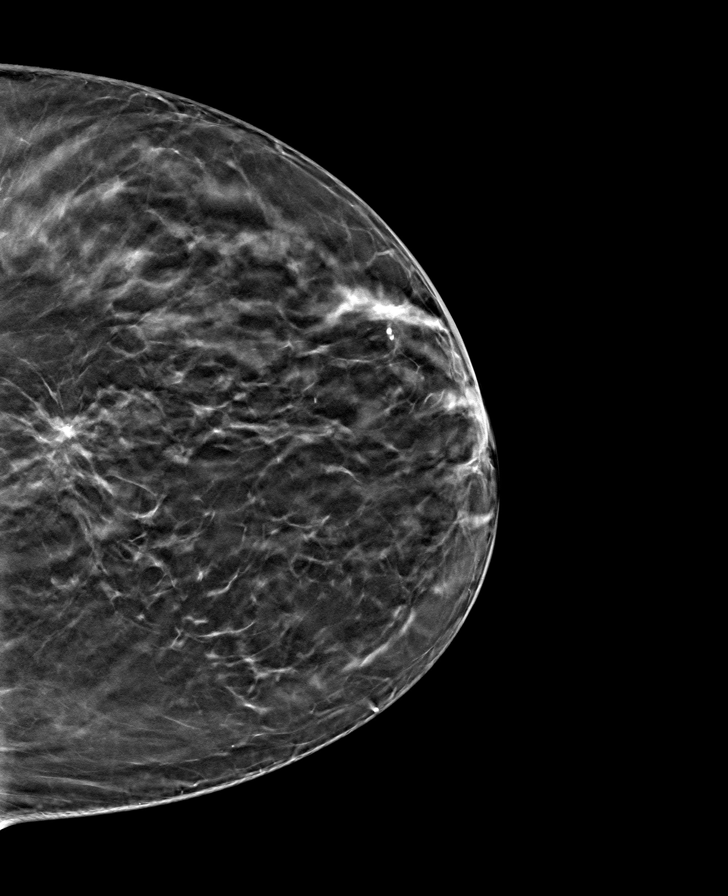

[L MLO tomo (1 of 2) · tomo slice 40/79.0]
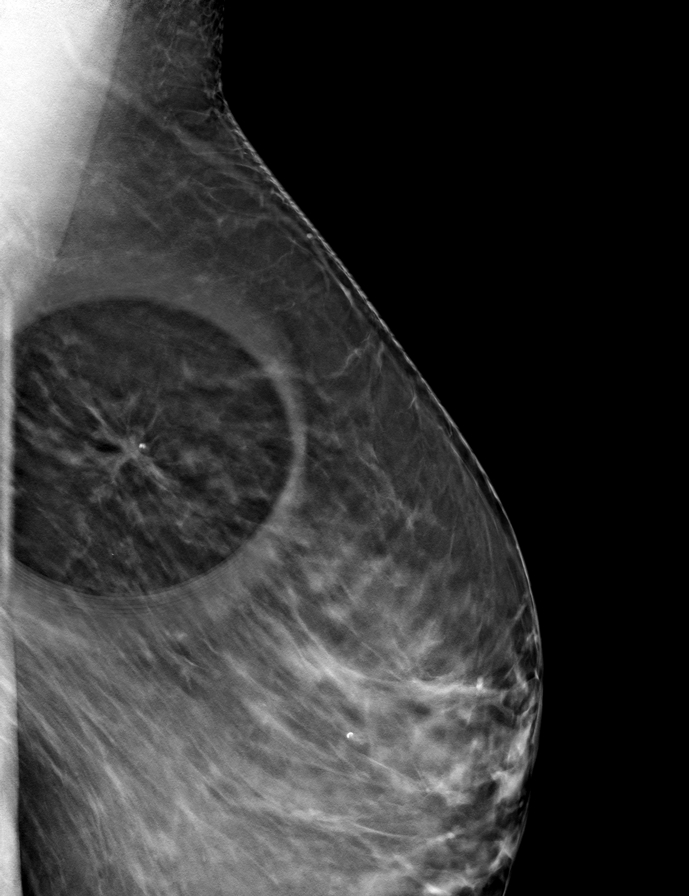

[L MLO tomo (2 of 2) · tomo slice 43/85.0]
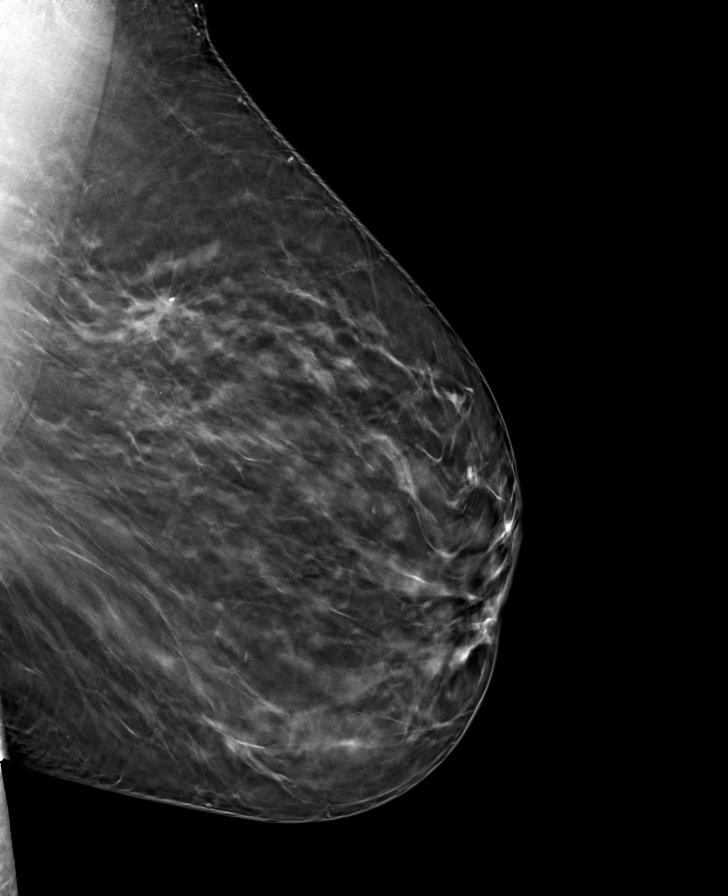

[8 of 24 positions shown; findings below may reference images not displayed]

ACR Breast Density Category b: There are scattered areas of
fibroglandular density.
FINDINGS: Interval post-biopsy changes are demonstrated in the superior
central left breast at posterior depth. A coil shaped biopsy clip is
identified with focal, likely biopsy-related distortion. No
additional suspicious findings are identified in the remainder of
the left breast.

Mammographic images were processed with CAD.
IMPRESSION: Probably benign, probable biopsy-related changes of the upper left
breast. Recommendation is for a single additional mammographic
follow-up to ensure stability.

RECOMMENDATION:
Bilateral diagnostic mammogram in May 2020.

I have discussed the findings and recommendations with the patient.
If applicable, a reminder letter will be sent to the patient
regarding the next appointment.

BI-RADS CATEGORY  3: Probably benign.

## 2021-10-26 ENCOUNTER — Ambulatory Visit: Payer: 59 | Admitting: Allergy and Immunology

## 2022-07-04 ENCOUNTER — Encounter (HOSPITAL_BASED_OUTPATIENT_CLINIC_OR_DEPARTMENT_OTHER): Payer: Self-pay

## 2022-07-21 ENCOUNTER — Other Ambulatory Visit (HOSPITAL_BASED_OUTPATIENT_CLINIC_OR_DEPARTMENT_OTHER): Payer: Self-pay | Admitting: Cardiovascular Disease

## 2022-07-21 NOTE — Telephone Encounter (Signed)
Rx request sent to pharmacy.  

## 2022-07-27 ENCOUNTER — Other Ambulatory Visit: Payer: Self-pay | Admitting: Physician Assistant

## 2022-07-27 ENCOUNTER — Ambulatory Visit
Admission: RE | Admit: 2022-07-27 | Discharge: 2022-07-27 | Disposition: A | Discharge status: Home / Self Care | Payer: 59 | Source: Ambulatory Visit | Attending: Physician Assistant | Admitting: Physician Assistant

## 2022-07-27 DIAGNOSIS — M549 Dorsalgia, unspecified: Secondary | ICD-10-CM

## 2022-10-19 ENCOUNTER — Other Ambulatory Visit (HOSPITAL_BASED_OUTPATIENT_CLINIC_OR_DEPARTMENT_OTHER): Payer: Self-pay | Admitting: Cardiovascular Disease

## 2022-10-19 NOTE — Telephone Encounter (Signed)
Please call pt to schedule overdue follow-up with Dr. Oval Linsey. Pt last seen 07/2021 ans was due for follow-up 07/2022. Pt needing refills. Thank you!

## 2022-10-20 NOTE — Telephone Encounter (Signed)
Left message for patient to call and schedule overdue follow up with Dr. Ned Grace / APP for medication refills

## 2022-10-25 NOTE — Telephone Encounter (Signed)
Patient is scheduled 10/28/22 with Laurann Montana, NP

## 2022-10-25 NOTE — Telephone Encounter (Signed)
Rx request sent to pharmacy.  

## 2022-10-28 ENCOUNTER — Encounter (HOSPITAL_BASED_OUTPATIENT_CLINIC_OR_DEPARTMENT_OTHER): Payer: Self-pay

## 2022-10-28 ENCOUNTER — Ambulatory Visit (INDEPENDENT_AMBULATORY_CARE_PROVIDER_SITE_OTHER): Payer: 59 | Admitting: Family

## 2022-10-28 ENCOUNTER — Encounter (HOSPITAL_BASED_OUTPATIENT_CLINIC_OR_DEPARTMENT_OTHER): Payer: Self-pay | Admitting: Family

## 2022-10-28 VITALS — BP 126/70 | HR 84 | Ht 65.0 in | Wt 197.0 lb

## 2022-10-28 DIAGNOSIS — I10 Essential (primary) hypertension: Secondary | ICD-10-CM | POA: Diagnosis not present

## 2022-10-28 DIAGNOSIS — E782 Mixed hyperlipidemia: Secondary | ICD-10-CM | POA: Diagnosis not present

## 2022-10-28 DIAGNOSIS — R002 Palpitations: Secondary | ICD-10-CM

## 2022-10-28 MED ORDER — ROSUVASTATIN CALCIUM 5 MG PO TABS: 5.0000 mg | ORAL_TABLET | Freq: Every day | ORAL | 3 refills | Status: DC

## 2022-10-28 NOTE — Progress Notes (Signed)
Office Visit    Patient Name: Judy Jones Date of Encounter: 10/28/2022  PCP:  Judy Contras, MD   [place]  Cardiologist:  Judy Latch, MD  Advanced Practice Provider:  No care team member to display Electrophysiologist:  None      Chief Complaint    Judy Jones is a 57 y.o. female presents today for hypertension follow-up  Past Medical History    Past Medical History:  Diagnosis Date   ALLERGIC RHINITIS 05/30/2008   Qualifier: Diagnosis of  By: Judy Caller MD, Judy Jones     Allergy    Anxiety    Arthritis    Cancer ([place]) 07/2018   Basal cell--nose   Cataract    removed both    Chest tightness [id number]   Complication of anesthesia    Cough 05/30/2008   Qualifier: Diagnosis of  By: Judy Caller MD, Judy Jones     Diet-controlled diabetes mellitus ([place]) 04/06/2020   Elevated hemoglobin A1c 2018   Essential hypertension 04/06/2020   Family history of anesthesia complication    Mother get sick on her stomach   Genuine stress incontinence, female 04/23/2014   GERD (gastroesophageal reflux disease)    Hypertension    IBS (irritable bowel syndrome)    Overweight 04/06/2020   PONV (postoperative nausea and vomiting)    Shortness of breath 02/26/2021   Tachycardia 02/26/2021   Wears glasses    Past Surgical History:  Procedure Laterality Date   BREAST BIOPSY Left 06/2019   CARPAL TUNNEL RELEASE Left 10/24/2014   Procedure: LEFT CARPAL TUNNEL ;  Surgeon: Roseanne Kaufman, MD;  Location: [place];  Service: Orthopedics;  Laterality: Left;   CATARACT EXTRACTION, BILATERAL     CESAREAN SECTION  1985   CHOLECYSTECTOMY  1991   COLONOSCOPY     Hx: of   EYE SURGERY  2013   both cataracts   MOHS SURGERY  07/2018   nose--basal cell   right leg at knee   ULNAR NERVE TRANSPOSITION Right 08/01/2013   Procedure: RIGHT LIMITED CARPAL TUNNEL RELEASE,RIGHT CUBITAL TUNNEL RELEASE; right cubital tunnel release INSITU ,EXCISION  RIGHT MIDDLE FINGER MASS;  Surgeon: Roseanne Kaufman, MD;  Location: [place];  Service: Orthopedics;  Laterality: Right;   ULNAR NERVE TRANSPOSITION Left 10/24/2014   Procedure: ULNAR NERVE DECOMPRESSION AT ELBOW;  Surgeon: Roseanne Kaufman, MD;  Location: [place];  Service: Orthopedics;  Laterality: Left;   VAGINAL HYSTERECTOMY  2007   ant/post repair   WISDOM TOOTH EXTRACTION      Allergies  Allergies  Allergen Reactions   Shellfish Allergy Anaphylaxis   Shrimp (Diagnostic) Swelling    Other reaction(s): tongue swelling   Morphine Hives   Morphine Sulfate     Other reaction(s): rash   Other Cough    Pet dander-coughing, runny nose, itchy eyes   Penicillin V     Other reaction(s): rash   Penicillins Other (See Comments)    Childhood reaction: possibly hives    Percocet [Oxycodone-Acetaminophen] Nausea And Vomiting   Clarithromycin Nausea And Vomiting    Other reaction(s): gi upset   Flagyl [Metronidazole] Nausea Only    Per patient report    History of Present Illness    Judy Jones is a 57 y.o. female with a hx of anxiety, arthritis, basal cell carcinoma (nose), HTN, HLD, diet controlled diabetes, GERD, IBS, palpitations last seen 08/13/21 by Dr. Oval Linsey.  At last visit 08/13/21 her palpitations and tachycardia were improved  as prior stressors had resolved.   She presents today for follow up independently.  Excited for her upcoming trip to [place] with work.  She works as an Forensic scientist.  Notes her husband recently had knee replacement surgery so she has not been as active in his recovery process helping him with more things.  She reports her palpitations have been overall well-controlled.  She notes if she is more stressed or has caffeine she will have increased palpitations.  No chest pain, dyspnea, near-syncope, syncope.  She is interested in being more active.  She remains understandably concerned regarding monitoring of palpitations as both her  mother and sister have atrial fibrillation.  She does wear a smart watch which has not alerted her to any atrial fibrillation.   EKGs/Labs/Other Studies Reviewed:   The following studies were reviewed today: Cardiac Studies & Procedures     STRESS TESTS  MYOCARDIAL PERFUSION IMAGING 04/16/2020  Narrative  Nuclear stress EF: 80%.  The left ventricular ejection fraction is hyperdynamic (>65%).  There was no ST segment deviation noted during stress.  No T wave inversion was noted during stress.  The study is normal.  This is a low risk study.   ECHOCARDIOGRAM  ECHOCARDIOGRAM COMPLETE 03/25/2021  Narrative ECHOCARDIOGRAM REPORT    Patient Name:   Judy Jones Date of Exam: 03/25/2021 Medical Rec #:  [id number]            Height:       65.0 in Accession #:    [id number]           Weight:       191.0 lb Date of Birth:  [birth year]            BSA:          1.940 m Patient Age:    23 years             BP:           168/84 mmHg Patient Gender: F                    HR:           9 bpm. Exam Location:  [place]  Procedure: 2D Echo, Cardiac Doppler, Color Doppler and Strain Analysis  Indications:    R06.00 SOB  History:        Patient has no prior history of Echocardiogram examinations. Arrythmias:Tachycardia, Signs/Symptoms:Chest tightness; Risk Factors:Hypertension, HLD and Diabetes.  Sonographer:    Marygrace Drought RCS Referring Phys: [id number] TIFFANY Paoli  IMPRESSIONS   1. Left ventricular ejection fraction, by estimation, is >75%. The left ventricle has hyperdynamic function. The left ventricle has no regional wall motion abnormalities. Left ventricular diastolic parameters are consistent with Grade I diastolic dysfunction (impaired relaxation). The average left ventricular global longitudinal strain is -18.9 %. The global longitudinal strain is normal. 2. Right ventricular systolic function is normal. The right ventricular size is normal. 3. The mitral  valve is normal in structure. No evidence of mitral valve regurgitation. No evidence of mitral stenosis. 4. The aortic valve is tricuspid. Aortic valve regurgitation is not visualized. No aortic stenosis is present. 5. The inferior vena cava is normal in size with greater than 50% respiratory variability, suggesting right atrial pressure of 3 mmHg.  FINDINGS Left Ventricle: Left ventricular ejection fraction, by estimation, is >75%. The left ventricle has hyperdynamic function. The left ventricle has no regional wall motion abnormalities. The average left ventricular global longitudinal  strain is -18.9 %. The global longitudinal strain is normal. The left ventricular internal cavity size was normal in size. There is no left ventricular hypertrophy. Left ventricular diastolic parameters are consistent with Grade I diastolic dysfunction (impaired relaxation).  Right Ventricle: The right ventricular size is normal. Right ventricular systolic function is normal.  Left Atrium: Left atrial size was normal in size.  Right Atrium: Right atrial size was normal in size.  Pericardium: There is no evidence of pericardial effusion.  Mitral Valve: The mitral valve is normal in structure. No evidence of mitral valve regurgitation. No evidence of mitral valve stenosis.  Tricuspid Valve: The tricuspid valve is normal in structure. Tricuspid valve regurgitation is trivial. No evidence of tricuspid stenosis.  Aortic Valve: The aortic valve is tricuspid. Aortic valve regurgitation is not visualized. No aortic stenosis is present.  Pulmonic Valve: The pulmonic valve was not well visualized. Pulmonic valve regurgitation is not visualized. No evidence of pulmonic stenosis.  Aorta: The aortic root is normal in size and structure.  Venous: The inferior vena cava is normal in size with greater than 50% respiratory variability, suggesting right atrial pressure of 3 mmHg.  IAS/Shunts: The interatrial septum was not  well visualized.   LEFT VENTRICLE PLAX 2D LVIDd:         4.30 cm  Diastology LVIDs:         2.80 cm  LV e' medial:    5.77 cm/s LV PW:         0.80 cm  LV E/e' medial:  14.7 LV IVS:        0.80 cm  LV e' lateral:   11.10 cm/s LVOT diam:     2.00 cm  LV E/e' lateral: 7.6 LV SV:         75 LV SV Index:   39       2D Longitudinal Strain LVOT Area:     3.14 cm 2D Strain GLS (A2C):   -20.1 % 2D Strain GLS (A3C):   -19.2 % 2D Strain GLS (A4C):   17.4 % 2D Strain GLS Avg:     -18.9 %  RIGHT VENTRICLE RV Basal diam:  2.70 cm RV S prime:     13.10 cm/s TAPSE (M-mode): 2.2 cm  LEFT ATRIUM             Index       RIGHT ATRIUM          Index LA diam:        2.60 cm 1.34 cm/m  RA Area:     8.80 cm LA Vol (A2C):   30.4 ml 15.67 ml/m RA Volume:   16.00 ml 8.25 ml/m LA Vol (A4C):   19.3 ml 9.95 ml/m LA Biplane Vol: 25.6 ml 13.20 ml/m AORTIC VALVE LVOT Vmax:   102.00 cm/s LVOT Vmean:  66.600 cm/s LVOT VTI:    0.238 m  AORTA Ao Root diam: 2.80 cm Ao Asc diam:  3.40 cm  MITRAL VALVE MV Area (PHT):              SHUNTS MV Decel Time:              Systemic VTI:  0.24 m MV E velocity: 84.80 cm/s   Systemic Diam: 2.00 cm MV A velocity: 106.00 cm/s MV E/A ratio:  0.80  Kirk Ruths MD Electronically signed by Kirk Ruths MD Signature Date/Time: 03/25/2021/4:18:28 PM    Final  EKG:  EKG is ordered today.  The ekg ordered today demonstrates NSR 84 bpm with no acute ST/T wave changes.   Recent Labs: No results found for requested labs within last 365 days.  Recent Lipid Panel    Component Value Date/Time   CHOL 162 06/17/2020 0939   TRIG 84 06/17/2020 0939   HDL 53 06/17/2020 0939   CHOLHDL 3.1 06/17/2020 0939   LDLCALC 93 06/17/2020 0939     Home Medications   Current Meds  Medication Sig   ALPRAZolam (XANAX) 0.25 MG tablet Take 0.25 mg by mouth daily as needed for anxiety.   cetirizine (ZYRTEC) 10 MG tablet Take 1 tablet (10 mg total) by mouth 2  (two) times daily as needed for allergies (Can use an extra dose during flares).   Cholecalciferol (VITAMIN D) 2000 UNITS CAPS Take 2,000 Units by mouth daily.   esomeprazole (NEXIUM) 40 MG capsule Take 40 mg by mouth every evening.   fluticasone (FLONASE) 50 MCG/ACT nasal spray Place 1 spray into both nostrils as needed.    loratadine (CLARITIN) 10 MG tablet Take 10 mg by mouth daily.   losartan-hydrochlorothiazide (HYZAAR) 100-12.5 MG per tablet Take 1 tablet by mouth daily.    Magnesium 250 MG TABS Take 250 mg by mouth at bedtime.   ondansetron (ZOFRAN) 4 MG tablet Take 4 mg by mouth daily as needed for nausea or vomiting.   [DISCONTINUED] rosuvastatin (CRESTOR) 5 MG tablet Take 1 tablet (5 mg total) by mouth daily. Please keep your upcoming appointment for refills.   Current Facility-Administered Medications for the 10/28/22 encounter (Office Visit) with Loel Dubonnet, NP  Medication   0.9 %  sodium chloride infusion     Review of Systems      All other systems reviewed and are otherwise negative except as noted above.  Physical Exam    VS:  BP 126/70   Pulse 84   Ht 5\' 5"  (1.651 m)   Wt 197 lb (89.4 kg)   LMP 01/13/2006   BMI 32.78 kg/m  , BMI Body mass index is 32.78 kg/m.  Wt Readings from Last 3 Encounters:  10/28/22 197 lb (89.4 kg)  08/13/21 194 lb 9.6 oz (88.3 kg)  07/13/21 199 lb 6.4 oz (90.4 kg)     GEN: Well nourished, well developed, in no acute distress. HEENT: normal. Neck: Supple, no JVD, carotid bruits, or masses. Cardiac: RRR, no murmurs, rubs, or gallops. No clubbing, cyanosis, edema.  Radials/PT 2+ and equal bilaterally.  Respiratory:  Respirations regular and unlabored, clear to auscultation bilaterally. GI: Soft, nontender, nondistended. MS: No deformity or atrophy. Skin: Warm and dry, no rash. Neuro:  Strength and sensation are intact. Psych: Normal affect.  Assessment & Plan    HTN - BP well controlled. Continue current antihypertensive  regimen losartan-HCTZ 100-12.5 mg daily.  HLD, LDL goal <100 -most recent LDL 96 which is at goal.  Continue rosuvastatin 5 mg daily.  Refill provided.  Palpitations/sinus tachycardia-overall well-controlled.  Noted to be triggered by stress or increased caffeine intake.  Smart watch has not loaded started any episodes of atrial fibrillation.  We discussed that if she has worsening palpitations or concern for atrial fibrillation (noted family history)  would plan for ZIO monitor.  Not indicated at this time BMI 32 - Weight loss via diet and exercise encouraged. Discussed the impact being overweight would have on cardiovascular risk. Motivated to lose weight. Discussed Right Start Program at [place].  Disposition: Follow up in 1 year(s) with Judy Latch, MD or APP.  Signed, Loel Dubonnet, NP 10/28/2022, 4:25 PM [facility]

## 2022-10-28 NOTE — Patient Instructions (Signed)
Medication Instructions:  Your physician recommends that you continue on your current medications as directed. Please refer to the Current Medication list given to you today.  *If you need a refill on your cardiac medications before your next appointment, please call your pharmacy*  Follow-Up: At [facility], you and your health needs are our priority.  As part of our continuing mission to provide you with exceptional heart care, we have created designated Provider Care Teams.  These Care Teams include your primary Cardiologist (physician) and Advanced Practice Providers (APPs -  Physician Assistants and Nurse Practitioners) who all work together to provide you with the care you need, when you need it.  We recommend signing up for the patient portal called "MyChart".  Sign up information is provided on this After Visit Summary.  MyChart is used to connect with patients for Virtual Visits (Telemedicine).  Patients are able to view lab/test results, encounter notes, upcoming appointments, etc.  Non-urgent messages can be sent to your provider as well.   To learn more about what you can do with MyChart, go to [email or web address].    Your next appointment:   1 year(s)  Provider:   Tiffany , MD    

## 2023-01-17 ENCOUNTER — Other Ambulatory Visit: Payer: Self-pay | Admitting: Obstetrics and Gynecology

## 2023-01-17 DIAGNOSIS — Z1231 Encounter for screening mammogram for malignant neoplasm of breast: Secondary | ICD-10-CM

## 2023-01-18 ENCOUNTER — Other Ambulatory Visit: Payer: Self-pay | Admitting: Family Medicine

## 2023-01-18 DIAGNOSIS — R1310 Dysphagia, unspecified: Secondary | ICD-10-CM

## 2023-01-19 ENCOUNTER — Ambulatory Visit
Admission: RE | Admit: 2023-01-19 | Discharge: 2023-01-19 | Disposition: A | Discharge status: Home / Self Care | Payer: 59 | Source: Ambulatory Visit | Attending: Obstetrics and Gynecology | Admitting: Obstetrics and Gynecology

## 2023-01-19 DIAGNOSIS — Z1231 Encounter for screening mammogram for malignant neoplasm of breast: Secondary | ICD-10-CM

## 2023-01-23 ENCOUNTER — Other Ambulatory Visit: Payer: Self-pay | Admitting: Obstetrics and Gynecology

## 2023-01-23 ENCOUNTER — Ambulatory Visit
Admission: RE | Admit: 2023-01-23 | Discharge: 2023-01-23 | Disposition: A | Discharge status: Home / Self Care | Payer: 59 | Source: Ambulatory Visit | Attending: Family Medicine | Admitting: Family Medicine

## 2023-01-23 ENCOUNTER — Other Ambulatory Visit: Payer: Self-pay | Admitting: Family Medicine

## 2023-01-23 DIAGNOSIS — R1310 Dysphagia, unspecified: Secondary | ICD-10-CM

## 2023-01-23 DIAGNOSIS — R928 Other abnormal and inconclusive findings on diagnostic imaging of breast: Secondary | ICD-10-CM

## 2023-01-31 ENCOUNTER — Ambulatory Visit
Admission: RE | Admit: 2023-01-31 | Discharge: 2023-01-31 | Disposition: A | Discharge status: Home / Self Care | Payer: 59 | Source: Ambulatory Visit | Attending: Obstetrics and Gynecology | Admitting: Obstetrics and Gynecology

## 2023-01-31 DIAGNOSIS — R928 Other abnormal and inconclusive findings on diagnostic imaging of breast: Secondary | ICD-10-CM

## 2023-02-01 ENCOUNTER — Other Ambulatory Visit: Payer: Self-pay | Admitting: Obstetrics and Gynecology

## 2023-02-01 DIAGNOSIS — R921 Mammographic calcification found on diagnostic imaging of breast: Secondary | ICD-10-CM

## 2023-02-04 ENCOUNTER — Ambulatory Visit
Admission: RE | Admit: 2023-02-04 | Discharge: 2023-02-04 | Disposition: A | Discharge status: Home / Self Care | Payer: 59 | Source: Ambulatory Visit | Attending: Obstetrics and Gynecology | Admitting: Obstetrics and Gynecology

## 2023-02-04 DIAGNOSIS — R921 Mammographic calcification found on diagnostic imaging of breast: Secondary | ICD-10-CM

## 2023-02-04 HISTORY — PX: BREAST BIOPSY: SHX20

## 2023-04-18 NOTE — Progress Notes (Deleted)
57 y.o. G59P2002 Married Caucasian female here for annual exam.    PCP:     Patient's last menstrual period was 01/13/2006.           Sexually active: {yes no:314532}  The current method of family planning is status post hysterectomy.    Exercising: {yes no:314532}  {types:19826} Smoker:  no  Health Maintenance: Pap:  06/28/11 neg History of abnormal Pap:  no MMG:  01/31/23 Breast Density Cat B, BI-RADS CAT 3 probably benign Colonoscopy:  10/23/20 BMD:   n/a  Result  n/a TDaP:  2014 Gardasil:   no HIV: neg in past Hep C: 05/13/16 neg Screening Labs:  Hb today: ***, Urine today: ***   reports that she has never smoked. She has never used smokeless tobacco. She reports that she does not currently use alcohol after a past usage of about 1.0 standard drink of alcohol per week. She reports that she does not use drugs.  Past Medical History:  Diagnosis Date   ALLERGIC RHINITIS 05/30/2008   Qualifier: Diagnosis of  By: Marchelle Gearing MD, Murali     Allergy    Anxiety    Arthritis    Cancer ([facility]) 07/2018   Basal cell--nose   Cataract    removed both    Chest tightness 04/06/2020   Complication of anesthesia    Cough 05/30/2008   Qualifier: Diagnosis of  By: Marchelle Gearing MD, Murali     Diet-controlled diabetes mellitus ([facility]) 04/06/2020   Elevated hemoglobin A1c 2018   Essential hypertension 04/06/2020   Family history of anesthesia complication    Mother get sick on her stomach   Genuine stress incontinence, female 04/23/2014   GERD (gastroesophageal reflux disease)    Hypertension    IBS (irritable bowel syndrome)    Overweight 04/06/2020   PONV (postoperative nausea and vomiting)    Shortness of breath 02/26/2021   Tachycardia 02/26/2021   Wears glasses     Past Surgical History:  Procedure Laterality Date   BREAST BIOPSY Left 06/2019   BREAST BIOPSY Right 02/04/2023   MM RT BREAST BX W LOC DEV 1ST LESION IMAGE BX SPEC STEREO GUIDE 02/04/2023 [facility] M[facility]MO[facility]   CARPAL TUNNEL  RELEASE Left 10/24/2014   Procedure: LEFT CARPAL TUNNEL ;  Surgeon: Dominica Severin, MD;  Location: [facility];  Service: Orthopedics;  Laterality: Left;   CATARACT EXTRACTION, BILATERAL     CESAREAN SECTION  1985   CHOLECYSTECTOMY  1991   COLONOSCOPY     Hx: of   EYE SURGERY  2013   both cataracts   MOHS SURGERY  07/2018   nose--basal cell   right leg at knee   ULNAR NERVE TRANSPOSITION Right 08/01/2013   Procedure: RIGHT LIMITED CARPAL TUNNEL RELEASE,RIGHT CUBITAL TUNNEL RELEASE; right cubital tunnel release INSITU ,EXCISION RIGHT MIDDLE FINGER MASS;  Surgeon: Dominica Severin, MD;  Location[facility];  Service: Orthopedics;  Laterality: Right;   ULNAR NERVE TRANSPOSITION Left 10/24/2014   Procedure: ULNAR NERVE DECOMPRESSION AT ELBOW;  Surgeon: Dominica Severin, MD;  Location: [facility];  Service: Orthopedics;  Laterality: Left;   VAGINAL HYSTERECTOMY  2007   ant/post repair   WISDOM TOOTH EXTRACTION      Current Outpatient Medications  Medication Sig Dispense Refill   ALPRAZolam (XANAX) 0.25 MG tablet Take 0.25 mg by mouth daily as needed for anxiety.     cetirizine (ZYRTEC) 10 MG tablet Take 1 tablet (10 mg total) by mouth 2 (two) times daily as needed  for allergies (Can use an extra dose during flares). 60 tablet 5   Cholecalciferol (VITAMIN D) 2000 UNITS CAPS Take 2,000 Units by mouth daily.     esomeprazole (NEXIUM) 40 MG capsule Take 40 mg by mouth every evening.     fluticasone (FLONASE) 50 MCG/ACT nasal spray Place 1 spray into both nostrils as needed.      loratadine (CLARITIN) 10 MG tablet Take 10 mg by mouth daily.     losartan-hydrochlorothiazide (HYZAAR) 100-12.5 MG per tablet Take 1 tablet by mouth daily.      Magnesium 250 MG TABS Take 250 mg by mouth at bedtime.     ondansetron (ZOFRAN) 4 MG tablet Take 4 mg by mouth daily as needed for nausea or vomiting.     rosuvastatin (CRESTOR) 5 MG tablet Take 1 tablet (5 mg total) by mouth daily. 90 tablet  3   Current Facility-Administered Medications  Medication Dose Route Frequency Provider Last Rate Last Admin   0.9 %  sodium chloride infusion  500 mL Intravenous Continuous Mansouraty, Netty Starring., MD        Family History  Problem Relation Age of Onset   Hypertension Mother    Atrial fibrillation Mother    Kidney disease Mother    Alzheimer's disease Father    Atrial fibrillation Sister    Stroke Sister    Bipolar disorder Other    Colon cancer Neg Hx    Colon polyps Neg Hx    Esophageal cancer Neg Hx    Rectal cancer Neg Hx    Stomach cancer Neg Hx     Review of Systems  Exam:   LMP 01/13/2006     General appearance: alert, cooperative and appears stated age Head: normocephalic, without obvious abnormality, atraumatic Neck: no adenopathy, supple, symmetrical, trachea midline and thyroid normal to inspection and palpation Lungs: clear to auscultation bilaterally Breasts: normal appearance, no masses or tenderness, No nipple retraction or dimpling, No nipple discharge or bleeding, No axillary adenopathy Heart: regular rate and rhythm Abdomen: soft, non-tender; no masses, no organomegaly Extremities: extremities normal, atraumatic, no cyanosis or edema Skin: skin color, texture, turgor normal. No rashes or lesions Lymph nodes: cervical, supraclavicular, and axillary nodes normal. Neurologic: grossly normal  Pelvic: External genitalia:  no lesions              No abnormal inguinal nodes palpated.              Urethra:  normal appearing urethra with no masses, tenderness or lesions              Bartholins and Skenes: normal                 Vagina: normal appearing vagina with normal color and discharge, no lesions              Cervix: no lesions              Pap taken: {yes no:314532} Bimanual Exam:  Uterus:  normal size, contour, position, consistency, mobility, non-tender              Adnexa: no mass, fullness, tenderness              Rectal exam: {yes no:314532}.   Confirms.              Anus:  normal sphincter tone, no lesions  Chaperone was present for exam:  ***  Assessment:   Well woman visit with gynecologic exam.   Plan:  Mammogram screening discussed. Self breast awareness reviewed. Pap and HR HPV as above. Guidelines for Calcium, Vitamin D, regular exercise program including cardiovascular and weight bearing exercise.   Follow up annually and prn.   Additional counseling given.  {yes [id number]. _______ minutes face to face time of which over 50% was spent in counseling.    After visit summary provided.

## 2023-05-02 ENCOUNTER — Ambulatory Visit: Payer: 59 | Admitting: Obstetrics and Gynecology

## 2023-05-02 ENCOUNTER — Ambulatory Visit: Payer: 59 | Admitting: Nurse Practitioner

## 2023-05-03 ENCOUNTER — Ambulatory Visit: Payer: 59 | Admitting: Gastroenterology

## 2023-05-11 NOTE — Progress Notes (Signed)
57 y.o. G40P2002 Married Caucasian female here for annual exam.    Decreased sex drive.  No pain with sex.  She would like to check her testosterone level.   FSH 48.4 on 05/27/19.   Wants thyroid and vit D checked.   Will have an MRI for back pain.   Having some joint pain.   She feels like something is in her esophagus.  Has seen GI for an endoscopy.    PCP:   Tally Joe, MD  Patient's last menstrual period was 01/13/2006.           Sexually active: Yes.    The current method of family planning is status post hysterectomy.    Exercising: Yes.     walking Smoker:  no  Health Maintenance: Pap:  06/28/11 neg History of abnormal Pap:  no MMG:  01/31/23 Breast Density Cat C, BI-RADS CAT 4 sus.  Bx showed fibrocystic change, sclerosing adenosis and and ductal hyperplasia microcalcifications.   Colonoscopy:  10/23/20 - due in 1- years.  BMD:   n/a  Result  n/a TDaP:  2014 - she will do next week.  Gardasil:   no HIV: neg in past Hep C: 05/13/16 neg Screening Labs: PCP Flu vaccine:  completed. Shinrix vaccine:  completed. Covid vaccine:  discussed.    reports that she has never smoked. She has never used smokeless tobacco. She reports that she does not currently use alcohol after a past usage of about 1.0 standard drink of alcohol per week. She reports that she does not use drugs.  Past Medical History:  Diagnosis Date   ALLERGIC RHINITIS 05/30/2008   Qualifier: Diagnosis of  By: Marchelle Gearing MD, Murali     Allergy    Anxiety    Arthritis    Cancer (HCC) 07/2018   Basal cell--nose   Cataract    removed both    Chest tightness 04/06/2020   Complication of anesthesia    Cough 05/30/2008   Qualifier: Diagnosis of  By: Marchelle Gearing MD, Murali     Diet-controlled diabetes mellitus (HCC) 04/06/2020   Elevated hemoglobin A1c 2018   Essential hypertension 04/06/2020   Family history of anesthesia complication    Mother get sick on her stomach   Genuine stress incontinence,  female 04/23/2014   GERD (gastroesophageal reflux disease)    Hypertension    IBS (irritable bowel syndrome)    Overweight 04/06/2020   PONV (postoperative nausea and vomiting)    Shortness of breath 02/26/2021   Tachycardia 02/26/2021   Wears glasses     Past Surgical History:  Procedure Laterality Date   BREAST BIOPSY Left 06/2019   BREAST BIOPSY Right 02/04/2023   MM RT BREAST BX W LOC DEV 1ST LESION IMAGE BX SPEC STEREO GUIDE 02/04/2023 [facility]MOGR[facility]   CARPAL TUNNEL RELEASE Left 10/24/2014   Procedure: LEFT CARPAL TUNNEL ;  Surgeon: Dominica Severin, MD;  Location: [facility];  Service: Orthopedics;  Laterality: Left;   CATARACT EXTRACTION, BILATERAL     CESAREAN SECTION  1985   CHOLECYSTECTOMY  1991   COLONOSCOPY     Hx: of   EYE SURGERY  2013   both cataracts   MOHS SURGERY  07/2018   nose--basal cell   right leg at knee   ULNAR NERVE TRANSPOSITION Right 08/01/2013   Procedure: RIGHT LIMITED CARPAL TUNNEL RELEASE,RIGHT CUBITAL TUNNEL RELEASE; right cubital tunnel release INSITU ,EXCISION RIGHT MIDDLE FINGER MASS;  Surgeon: Dominica Severin, MD;  Location: [facility];  Service:  Orthopedics;  Laterality: Right;   ULNAR NERVE TRANSPOSITION Left 10/24/2014   Procedure: ULNAR NERVE DECOMPRESSION AT ELBOW;  Surgeon: Dominica Severin, MD;  Location: [facility];  Service: Orthopedics;  Laterality: Left;   VAGINAL HYSTERECTOMY  2007   ant/post repair   WISDOM TOOTH EXTRACTION      Current Outpatient Medications  Medication Sig Dispense Refill   ALPRAZolam (XANAX) 0.25 MG tablet Take 0.25 mg by mouth daily as needed for anxiety.     Cholecalciferol (VITAMIN D) 2000 UNITS CAPS Take 2,000 Units by mouth daily.     esomeprazole (NEXIUM) 40 MG capsule Take 40 mg by mouth every evening.     fluticasone (FLONASE) 50 MCG/ACT nasal spray Place 1 spray into both nostrils as needed.      loratadine (CLARITIN) 10 MG tablet Take 10 mg by mouth daily.      losartan-hydrochlorothiazide (HYZAAR) 100-12.5 MG per tablet Take 1 tablet by mouth daily.      Magnesium 250 MG TABS Take 250 mg by mouth at bedtime.     ondansetron (ZOFRAN) 4 MG tablet Take 4 mg by mouth daily as needed for nausea or vomiting.     rosuvastatin (CRESTOR) 5 MG tablet Take 1 tablet (5 mg total) by mouth daily. 90 tablet 3   Current Facility-Administered Medications  Medication Dose Route Frequency Provider Last Rate Last Admin   0.9 %  sodium chloride infusion  500 mL Intravenous Continuous Mansouraty, Netty Starring., MD        Family History  Problem Relation Age of Onset   Hypertension Mother    Atrial fibrillation Mother    Kidney disease Mother    Alzheimer's disease Father    Atrial fibrillation Sister    Stroke Sister    Bipolar disorder Other    Colon cancer Neg Hx    Colon polyps Neg Hx    Esophageal cancer Neg Hx    Rectal cancer Neg Hx    Stomach cancer Neg Hx     Review of Systems  All other systems reviewed and are negative.   Exam:   BP 124/80 (BP Location: Left Arm, Patient Position: Sitting, Cuff Size: Normal)   Pulse 81   Ht 5' 4.5" (1.638 m)   Wt 191 lb (86.6 kg)   LMP 01/13/2006   SpO2 97%   BMI 32.28 kg/m     General appearance: alert, cooperative and appears stated age Head: normocephalic, without obvious abnormality, atraumatic Neck: no adenopathy, supple, symmetrical, trachea midline and thyroid normal to inspection and palpation Lungs: clear to auscultation bilaterally Breasts: normal appearance, no masses or tenderness, No nipple retraction or dimpling, No nipple discharge or bleeding, No axillary adenopathy Heart: regular rate and rhythm Abdomen: soft, non-tender; no masses, no organomegaly Extremities: extremities normal, atraumatic, no cyanosis or edema Skin: skin color, texture, turgor normal. Red patches of skin under the bilateral breasts.  Lymph nodes: cervical, supraclavicular, and axillary nodes normal. Neurologic:  grossly normal  Pelvic: External genitalia:  no lesions              No abnormal inguinal nodes palpated.              Urethra:  normal appearing urethra with no masses, tenderness or lesions              Bartholins and Skenes: normal                 Vagina: normal appearing vagina with normal color and  discharge, no lesions              Cervix: absent              Pap taken:  Bimanual Exam:  Uterus:  absent              Adnexa: no mass, fullness, tenderness              Rectal exam: yes.  Confirms.              Anus:  normal sphincter tone, no lesions  Chaperone was present for exam:  Warren Lacy, CMA  Assessment:   Well woman visit with gynecologic exam. Status post TVH and anterior and posterior colporrhaphy.  Ovaries remain. Decreased libido.  Candida of flexural folds of skin. Fatigue.  Prediabetic.  FH MTHFR.  FH stroke.   Plan: Mammogram screening discussed. Self breast awareness reviewed. Pap and HR HPV  not indicated.  Guidelines for Calcium, Vitamin D, regular exercise program including cardiovascular and weight bearing exercise. Will check testosterone level, TSH, vit D level. Rx for Nystatin powder.  Follow up annually and prn.   After visit summary provided.

## 2023-05-18 ENCOUNTER — Other Ambulatory Visit: Payer: Self-pay | Admitting: Family Medicine

## 2023-05-18 DIAGNOSIS — M5432 Sciatica, left side: Secondary | ICD-10-CM

## 2023-05-22 ENCOUNTER — Encounter: Payer: Self-pay | Admitting: Obstetrics and Gynecology

## 2023-05-22 ENCOUNTER — Ambulatory Visit (INDEPENDENT_AMBULATORY_CARE_PROVIDER_SITE_OTHER): Payer: 59 | Admitting: Obstetrics and Gynecology

## 2023-05-22 VITALS — BP 124/80 | HR 81 | Ht 64.5 in | Wt 191.0 lb

## 2023-05-22 DIAGNOSIS — Z01419 Encounter for gynecological examination (general) (routine) without abnormal findings: Secondary | ICD-10-CM | POA: Diagnosis not present

## 2023-05-22 DIAGNOSIS — B372 Candidiasis of skin and nail: Secondary | ICD-10-CM | POA: Diagnosis not present

## 2023-05-22 DIAGNOSIS — R6882 Decreased libido: Secondary | ICD-10-CM | POA: Diagnosis not present

## 2023-05-22 DIAGNOSIS — R5383 Other fatigue: Secondary | ICD-10-CM

## 2023-05-22 MED ORDER — NYSTATIN 100000 UNIT/GM EX POWD: 1.0000 | Freq: Three times a day (TID) | CUTANEOUS | 3 refills | Status: DC

## 2023-05-22 NOTE — Patient Instructions (Signed)

## 2023-05-25 ENCOUNTER — Encounter: Payer: Self-pay | Admitting: Family Medicine

## 2023-05-27 LAB — TESTOS,TOTAL,FREE AND SHBG (FEMALE)
Free Testosterone: 1.2 pg/mL (ref 0.1–6.4)
Sex Hormone Binding: 28.5 nmol/L (ref 14–73)
Testosterone, Total, LC-MS-MS: 10 ng/dL (ref 2–45)

## 2023-05-27 LAB — TSH: TSH: 2.58 m[IU]/L (ref 0.40–4.50)

## 2023-05-27 LAB — VITAMIN D 25 HYDROXY (VIT D DEFICIENCY, FRACTURES): Vit D, 25-Hydroxy: 70 ng/mL (ref 30–100)

## 2023-05-31 ENCOUNTER — Ambulatory Visit
Admission: RE | Admit: 2023-05-31 | Discharge: 2023-05-31 | Disposition: A | Discharge status: Home / Self Care | Payer: 59 | Source: Ambulatory Visit | Attending: Family Medicine | Admitting: Family Medicine

## 2023-05-31 DIAGNOSIS — M5432 Sciatica, left side: Secondary | ICD-10-CM

## 2023-06-28 ENCOUNTER — Encounter: Payer: Self-pay | Admitting: Podiatry

## 2023-06-28 ENCOUNTER — Ambulatory Visit (INDEPENDENT_AMBULATORY_CARE_PROVIDER_SITE_OTHER): Payer: 59 | Admitting: Podiatry

## 2023-06-28 DIAGNOSIS — L600 Ingrowing nail: Secondary | ICD-10-CM

## 2023-06-28 NOTE — Patient Instructions (Signed)

## 2023-06-29 NOTE — Progress Notes (Signed)
Subjective:   Patient ID: Judy Jones, female   DOB: 57 y.o.   MRN: [id number]   HPI Patient is concerned about some possible fungal infection of the big toenails of both feet with lifting and a painful ingrown toenail on the right big toe that makes it hard for her to wear shoe gear comfortably.  Patient's tried to trim and soak it and have pedicures without relief and does not smoke likes to be active   Review of Systems  All other systems reviewed and are negative.       Objective:  Physical Exam Vitals and nursing note reviewed.  Constitutional:      Appearance: She is well-developed.  Pulmonary:     Effort: Pulmonary effort is normal.  Musculoskeletal:        General: Normal range of motion.  Skin:    General: Skin is warm.  Neurological:     Mental Status: She is alert.     Neurovascular status intact muscle strength found to be adequate range of motion adequate with patient noted to have incurvated lateral border of the right big toe painful when pressed with no active drainage or intense redness noted.  Good digital perfusion well-oriented x 3.  There is also lifting of the nail.  But I think this is more due to trauma     Assessment:  Ingrown toenail deformity right hallux lateral border with pain.  Lifting of the nailbed that is occurring bilateral most likely trauma induced     Plan:  H&P reviewed and at this point I have recommended correction of deformity I explained procedure risk and patient wants surgery for condition.  I did go over with her complications and allowed her to read consent form she signed and I then infiltrated the right big toe 60 mg like Marcaine mixture sterile prep and using sterile instrumentation removed the lateral border exposed matrix applied phenol 3 applications 30 seconds followed by alcohol lavage sterile dressing gave instructions on soaks and to wear dressing 24 hours take it off earlier if throbbing were to occur and  encouraged her to call questions concerns which may arise

## 2023-11-17 ENCOUNTER — Other Ambulatory Visit: Payer: Self-pay | Admitting: Family Medicine

## 2023-11-17 ENCOUNTER — Encounter: Payer: Self-pay | Admitting: Family Medicine

## 2023-11-17 DIAGNOSIS — R310 Gross hematuria: Secondary | ICD-10-CM

## 2023-11-20 ENCOUNTER — Ambulatory Visit
Admission: RE | Admit: 2023-11-20 | Discharge: 2023-11-20 | Disposition: A | Discharge status: Home / Self Care | Source: Ambulatory Visit | Attending: Family Medicine

## 2023-11-20 DIAGNOSIS — R310 Gross hematuria: Secondary | ICD-10-CM

## 2023-12-13 ENCOUNTER — Telehealth: Payer: Self-pay | Admitting: Pharmacy Technician

## 2023-12-13 ENCOUNTER — Ambulatory Visit (HOSPITAL_BASED_OUTPATIENT_CLINIC_OR_DEPARTMENT_OTHER): Admitting: Nurse Practitioner

## 2023-12-13 ENCOUNTER — Encounter: Payer: Self-pay | Admitting: Pharmacy Technician

## 2023-12-13 ENCOUNTER — Encounter (HOSPITAL_BASED_OUTPATIENT_CLINIC_OR_DEPARTMENT_OTHER): Payer: Self-pay | Admitting: Nurse Practitioner

## 2023-12-13 VITALS — BP 122/76 | HR 100 | Ht 65.0 in | Wt 189.2 lb

## 2023-12-13 DIAGNOSIS — E782 Mixed hyperlipidemia: Secondary | ICD-10-CM

## 2023-12-13 DIAGNOSIS — Z7189 Other specified counseling: Secondary | ICD-10-CM

## 2023-12-13 DIAGNOSIS — R002 Palpitations: Secondary | ICD-10-CM

## 2023-12-13 DIAGNOSIS — R9431 Abnormal electrocardiogram [ECG] [EKG]: Secondary | ICD-10-CM | POA: Diagnosis not present

## 2023-12-13 DIAGNOSIS — R000 Tachycardia, unspecified: Secondary | ICD-10-CM

## 2023-12-13 DIAGNOSIS — I10 Essential (primary) hypertension: Secondary | ICD-10-CM

## 2023-12-13 MED ORDER — METOPROLOL TARTRATE 100 MG PO TABS: 100.0000 mg | ORAL_TABLET | ORAL | 0 refills | Status: DC

## 2023-12-13 MED ORDER — IVABRADINE HCL 5 MG PO TABS: ORAL_TABLET | ORAL | 0 refills | Status: DC

## 2023-12-13 NOTE — Patient Instructions (Signed)
 Medication Instructions:   Your physician recommends that you continue on your current medications as directed. Please refer to the Current Medication list given to you today.   *If you need a refill on your cardiac medications before your next appointment, please call your pharmacy*  Lab Work:  None ordered.  If you have labs (blood work) drawn today and your tests are completely normal, you will receive your results only by: MyChart Message (if you have MyChart) OR A paper copy in the mail If you have any lab test that is abnormal or we need to change your treatment, we will call you to review the results.  Testing/Procedures:    Your cardiac CT will be scheduled at one of the below locations:   [facility] and Vascular Tower [address]  [place], [place] [address] Opening December 11, 2023  If scheduled at the Heart and Vascular Tower at [facility] [employer], please enter the parking lot using the [address] entrance and use the FREE valet service at the patient drop-off area. Enter the buidling and check-in with registration on the main floor.  Please follow these instructions carefully (unless otherwise directed):  An IV will be required for this test and Nitroglycerin  will be given.  Hold all erectile dysfunction medications at least 3 days (72 hrs) prior to test. (Ie viagra, cialis, sildenafil, tadalafil, etc)   On the Night Before the Test: Be sure to Drink plenty of water. Do not consume any caffeinated/decaffeinated beverages or chocolate 12 hours prior to your test. Do not take any antihistamines 12 hours prior to your test.   On the Day of the Test: Drink plenty of water until 1 hour prior to the test. Do not eat any food 1 hour prior to test. You may take your regular medications prior to the test.  Take metoprolol (Lopressor) one (1) tablet ( 100 mg) two hours prior to test. Take Ivabradine ( Corlanor) one ( 1) tablet ( 5 mg) one hour prior to test.   If you take losartan- Hydrochlorothiazide/Spironolactone/Chlorthalidone, please HOLD on the morning of the test. FEMALES- please wear underwire-free bra if available, avoid dresses & tight clothing    After the Test: Drink plenty of water. After receiving IV contrast, you may experience a mild flushed feeling. This is normal. On occasion, you may experience a mild rash up to 24 hours after the test. This is not dangerous. If this occurs, you can take Benadryl  25 mg, Zyrtec , Claritin, or Allegra and increase your fluid intake. (Patients taking Tikosyn should avoid Benadryl , and may take Zyrtec , Claritin, or Allegra) If you experience trouble breathing, this can be serious. If it is severe call 911 IMMEDIATELY. If it is mild, please call our office.  We will call to schedule your test 2-4 weeks out understanding that some insurance companies will need an authorization prior to the service being performed.   For more information and frequently asked questions, please visit our website : [email or web address]  For non-scheduling related questions, please contact the cardiac imaging nurse navigator should you have any questions/concerns: Cardiac Imaging Nurse Navigators Direct Office Dial: [phone]   For scheduling needs, including cancellations and rescheduling, please call [place], [phone].   Follow-Up: At [facility], you and your health needs are our priority.  As part of our continuing mission to provide you with exceptional heart care, our providers are all part of one team.  This team includes your primary Cardiologist (physician) and Advanced Practice Providers or  APPs (Physician Assistants and Nurse Practitioners) who all work together to provide you with the care you need, when you need it.  Your next appointment:   4 month(s)  Provider:   Slater Duncan, NP    We recommend signing up for the patient portal called "MyChart".  Sign up information is  provided on this After Visit Summary.  MyChart is used to connect with patients for Virtual Visits (Telemedicine).  Patients are able to view lab/test results, encounter notes, upcoming appointments, etc.  Non-urgent messages can be sent to your provider as well.   To learn more about what you can do with MyChart, go to [email or web address].   Other Instructions  Tackling Obesity with Lifestyle Changes  Obesity- What is it? And What can we do about it?  Obesity is a chronic complex disease defined as excessive fat deposits that can have a negative effect on our health. It can lead to many other diseases including type 2 diabetes.  Weight gain occurs when the amount of energy (calories) we consume is greater than the amount we use.  When our energy output is greater than our energy input we lose weight. The basic concept is simple, but in reality, it's much more complicated.  Unfortunately, in some people, our bodies have many ways it can compensate when we try to eat less and move more which can prevent us  from changing our weight. This can lead to some people having a much more difficult time losing weight even when they put healthy habits into practice. This can be frustrating. We want to focus on healthy habits, physical activity and how we feel, and less the number on the scale.  Food As Energy  Calories  Calories is just a unit of measurement for energy.  Counting calories is not required to lose weight but counting for a short period of time can:   help you learn good portion sizes   Learn what your true energy needs are.   Help you be more aware of your snacking or grazing habits  To help calculate how many calories you should be eating, the NIH has a great body weight planner calculator at BeverageBuggy.si  Types of Energy Expenditure  Basal Metabolic Rate (BMR) Energy that our bodies use to preform everyday tasks. More muscle mass through resistance training can  increase this a small amount  Thermic Effect of Food The amount of energy that it takes to breakdown the food we eat. This will be highest when we eat protein and fiber rich foods  Exercise Energy Expenditure The amount of energy used during formal exercise (walking, biking, weightlifting)  Non-exercise activity thermogenesis (NEAT) The amount of energy spent on activities that are not formal exercise (standing, fidgeting). Therefore, it is not only important to do formal exercise but also move around throughout the day.  Managing The Meal  Macro nutrients (carbohydrates, fats and protein, fiber, water)  Micronutrients (vitamins, minerals)  Dietary Fiber  Benefits Examples Cautions  Soluble fiber  Decreases cholesterol  improve blood sugar control,  Feeds our gut bacteria  Allows us  to feel fuller for longer so we eat less  fruits  oats  barley  legumes  peas  Beans  vegetables (broccoli) and root vegetables (carrots) Add fiber into your diet slowly and be sure to drink at least 8 cups of water a day. This will help limit gas, bloating, diarrhea, or constipation.  Insoluble Fiber  Improves digestive health by making stool easier to pass  Allows us  to feel fuller for longer so we eat less  whole grains  nuts  seeds  skin of fruit  vegetables (green beans, zucchini, cauliflower)  Tricks to add more fiber to your diet   Add beans (pinto, kidney, lima, navy and garbanzo) to salads, ground meat or brown rice   Add nuts or seeds and or fresh/frozen fruit to yogurt, cottage cheese, salads or steel cut oats   Cut up vegetables and eat with hummus   Look for unsweetened whole grain cereals with at least 5g of fiber per serving   Switch to whole grain bread. Look for bread that has whole grain flour as the first ingredient and has more fiber than carbs if you were to multiple the fiber x 10.   Try bulgar, barely, quinoa, buckwheat, brown rice wild rice instead of white  rice   Keep frozen vegetables on hand to add to dishes or soups  Meal Planning:  Meal planning is the key to setting you up for success. Here are some examples of healthy meal options.  Breakfast  Option 1: Omelette with vegetables (1 egg, spinach, mushrooms, or other vegetable of your choice), 2 slices whole-grain toast, tip of thumb size butter or soft margarine,  cup low-fat milk or yogurt  Option 2: steel-cut rolled oats (? cup dry), 1 tbsp peanut butter added to cooked oats,  cup low-fat milk.  Option 3: 2 slices whole-grain or rye toast with avocado spread ( small avocado mased with herbs and pepper to taste), 1 poached egg or s[place] up (cooked to your liking)  Option 4:  cup plain 0% [place] yogurt topped with  cup berries and  cup walnuts or almonds, 2 slices whole-grain or rye toast, tip of thumb size soft margarine/butter  Lunch:  Option 1: 2 cups red lentil soup, green salad with 1 tbsp homemade vinaigrette (extra virgin olive oil and vinegar of choice plus spices)  Option 2: 3 oz. roasted chicken, 2 slices whole-grain bread, 2 tsp mayonnaise, mustard, lettuce, tomato if desired, 1 fruit (example: medium-sized apple or small pear)  Option 3: 3 oz. tuna packed in water, 1 whole-wheat pita (6 inch), 2 tsp mayonnaise, lettuce, tomato, or other non-starchy vegetable of your choice, 1 fruit (example: medium-sized apple or small pear)  Option 4: 1 serving of garden veggie buddha bowl with lentils and tahini sauce and 1 cup berries topped with  cup plain 0% Greek yogurt  Dinner:  Option 1: 1 serving roasted cauliflower salad, 3-4 oz. grilled or baked pork loin chop, 1/2 cup mashed potato, or brown rice or quinoa  Option 2: 1 serving fish (baked, grilled or air fried), green salad, 1 tbsp homemade vinaigrette,  cup cooked couscous  Option 3: 1 cup cooked whole grained pasta (example: spaghetti, spirals, macaroni),  cup favorite pasta sauce (preferably homemade), 3-4  oz. grilled or baked chicken, green salad, 1 tbsp homemade vinaigrette  Option 4: 1 serving oven roasted salmon,  cup mashed sweet potato or couscous or brown rice or quinoa, broccoli (steamed or roasted)  Healthy snacks:   Carrots or celery with 1 tbsp of hummus   1 medium-sized fruit (apple or orange)   1 cup plain 0% [place] yogurt with  cup berries   Half apple, sliced, with 1 tbsp (15 mL) peanut or almond butter  Dining out:  Eating away from home has become a part of many people's lifestyle. Making healthy choices when you are eating out is important too. Portion  size is an important part of healthy choices. Most branded fast-food places provide calories, sodium, and fat content for their menu items. www.calorieking.com would be great resource to find nutrition facts for your favorite brands and fast-food restaurants. Company specific website can be Chief Technology Officer for nutrition information for their items. (e.g. www.mcdonalds.com or www.nutritionix.com/[place]/menu/premium)  Here are some tips to help you make wise food choices when you are dining out.  Chose more often Avoid  Beverages   Choose more often: Water, low fat milk  Sugar-free/diet drinks  Unsweet tea or coffee    Avoid: Milkshakes, fruit drinks, regular pop  Alcohol, specialty drinks (e.g. iced cappuccino)  Fast food  Choose more often:  Garden salad  Mini subs, pita sandwiches ect with extra vegetables  plain burgers, grilled chicken  Vegetarian or cheese pizza with whole-grain crust    Avoid: Burgers/sandwiches with bacon, cheese, and high-fat sauces  [place] fries, fried chicken, fried fish, poutine, hash browns  Pizza with processed meats  Starters   Choose more often: Raw vegetables, salads (garden, spinach, fruit)  clear or vegetable soups  Seafood cocktail  Whole-grain breads and rolls    Avoid: Salads with high-fat dressings or toppings  Creamy soups  Wings, egg rolls  onion  rings, nachos  White or garlic bread  Main courses Grains & Starches (amount equal to  of your plate)  Choose more often:  Oatmeal, high-fiber/lower-sugar cereals  Whole-grain breads, rice, pasta, barley, couscous  Sweet potatoes    Avoid: Sugary, low-fiber cereals  Large bagels, muffins, croissants, white bread  [place] fries, hash browns, fried rice   Meat and alternative (amount equal to  of your plate)  Choose more often:  Lean meats, poultry, fish, eggs, low-fat cheese  Tofu, vegetable protein Legumes (e.g. lentils, chickpeas, beans)    Avoid: High-salt and/or high-fat meats (e.g. ribs, wings, sausages, wieners, processed lunch meats, imposter meats)   Vegetables (amount equal to  of your plate)  Choose more often:  Salads ([place], garden, spinach), plain vegetables   Avoid:  Salads with creamy, high-fat dressings and   Vegetables on sandwiches ect toppings like bacon bits, croutons, cheese  Desserts  Choose more often:  Fresh fruit, frozen yogourt, skim milk latte    Avoid: Cakes, pies, pastries, ice cream, cheesecake   Adopting a Healthy Lifestyle.   Weight: Know what a healthy weight is for you (roughly BMI <25) and aim to maintain this. You can calculate your body mass index on your smart phone. Unfortunately, this is not the most accurate measure of healthy weight, but it is the simplest measurement to use. A more accurate measurement involves body scanning which measures lean muscle, fat tissue and bony density. We do not have this equipment at [facility].    Diet: Aim for 7+ servings of fruits and vegetables daily Limit animal fats in diet for cholesterol and heart health - choose grass fed whenever available Avoid highly processed foods (fast food burgers, tacos, fried chicken, pizza, hot dogs, french fries)  Saturated fat comes in the form of butter, lard, coconut oil, margarine, partially hydrogenated oils, and fat in meat. These increase your risk of  cardiovascular disease.  Use healthy plant oils, such as olive, canola, soy, corn, sunflower and peanut.  Whole foods such as fruits, vegetables and whole grains have fiber  Men need > 38 grams of fiber per day Women need > 25 grams of fiber per day  Load up on vegetables and fruits - one-half of your  plate: Aim for color and variety, and remember that potatoes dont count. Go for whole grains - one-quarter of your plate: Whole wheat, barley, wheat berries, quinoa, oats, brown rice, and foods made with them. If you want pasta, go with whole wheat pasta. Protein power - one-quarter of your plate: Fish, chicken, beans, and nuts are all healthy, versatile protein sources. Limit red meat. You need carbohydrates for energy! The type of carbohydrate is more important than the amount. Choose carbohydrates such as vegetables, fruits, whole grains, beans, and nuts in the place of white rice, white pasta, potatoes (baked or fried), macaroni and cheese, cakes, cookies, and donuts.  If youre thirsty, drink water. Coffee and tea are good in moderation, but skip sugary drinks and limit milk and dairy products to one or two daily servings. Keep sugar intake at 6 teaspoons or 24 grams or LESS       Exercise: Aim for 150 min of moderate intensity exercise weekly for heart health, and weights twice weekly for bone health Stay active - any steps are better than no steps! Aim for 7-9 hours of sleep daily

## 2023-12-13 NOTE — Telephone Encounter (Signed)
 We received notification to do a prior authorization for Ivabradine HCl 5MG  tablets but this is only for 1 tablet. Insurance said they will only do a prior authization if this was for a daily therapy. I called [facility] and they ran this on a discount card and it is 13.99  I sent the patient a message

## 2023-12-13 NOTE — Progress Notes (Signed)
 Cardiology Office Note:  .   Date:  12/13/2023  ID:  Judy Jones, DOB [birth year], MRN [id number] PCP: Judy Bugler, MD  [facility] Providers Cardiologist:  Judy Sos, MD    Patient Profile: .      PMH Hypertension Hyperlipidemia GERD Anxiety Diet controlled diabetes Palpitations Arthritis Family history of heart disease Mother - atrial fibrillation Sister - atrial fibrillation and stroke  She established care in 2022 for palpitations and tachycardia which improved when prior stressors improved. BP was well-controlled on losartan and hydrochlorothiazide at that time.  Nuclear stress test 04/2020 with LVEF 80% and no ischemia.  Last cardiology clinic visit was 10/28/2022 with Judy Banks, NP.  She returned for independent follow-up.  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 Palpitations removed well controlled, only noted with increased caffeine or stress.  She was interested in being more active and remains understandably concerned about palpitations as both her mother and sister have atrial fibrillation.  She does wear a smart watch that has not alerted to A-fib.  Most recent LDL was 96 which is at goal of <100.  Advised that if palpitations worsened would plan for ZIO monitor.  BMI was 32 and she was encouraged to work on weight loss via lifestyle modification.  Right start program at [place] [facility] was discussed and one year f/u was recommended.        History of Present Illness: .    History of Present Illness Judy Jones is a very pleasant 58 year old female who presents today for follow-up of palpitations. She has concerns about abnormal screening results and leg pain. She had screening ABIs recently that showed potentially decreased blood flow in her legs.  She reports left leg pain down the side of her leg that is worse after sitting or sleeping.  No pain with walking. She also had elevated CRP. She reports recent  episode when her daughter was having emergency C-section that she had significant palpitations and elevated HR to 159 bpm per smart watch.  She reports there are times when she is stressed that she feels it is difficult to slow her heart rate down.  BP has been well-controlled but has been elevated at times during office visits. Her LDL was 95 in October and she has an A1c of 6.2, diet-controlled. Family history includes atrial fibrillation and stroke, with her mother and sister affected. Her sister had a recent debilitating stroke. She experienced what she now thinks was a panic attack, initially thought to be a heart attack, after her sister's stroke. She has a history of a sprained ankle with persistent swelling in RLE. She travels frequently and is concerned about blood clots during flights. She has no symptoms of sleep apnea but occasionally snores. She denies presyncope, syncope, orthopnea, PND.   Discussed the use of AI scribe software for clinical note transcription with the patient, who gave verbal consent to proceed.   ROS: See HPI       Studies Reviewed: [place]   EKG Interpretation Date/Time:  Wednesday December 13 2023 15:14:37 EDT Ventricular Rate:  97 PR Interval:  124 QRS Duration:  72 QT Interval:  344 QTC Calculation: 436 R Axis:   19  Text Interpretation: Normal sinus rhythm Cannot rule out Anterior infarct , age undetermined Confirmed by Judy Jones [phone]) on 12/13/2023 3:37:51 PM     No results found for: "LIPOA"   Risk Assessment/Calculations:  Physical Exam:   VS:  BP 122/76   Pulse 100   Ht 5\' 5"  (1.651 m)   Wt 189 lb 3.2 oz (85.8 kg)   LMP 01/13/2006   SpO2 97%   BMI 31.48 kg/m    Wt Readings from Last 3 Encounters:  12/13/23 189 lb 3.2 oz (85.8 kg)  05/22/23 191 lb (86.6 kg)  10/28/22 197 lb (89.4 kg)    GEN: Well nourished, well developed in no acute distress NECK: No JVD; No carotid bruits CARDIAC: RRR, no murmurs, rubs,  gallops RESPIRATORY:  Clear to auscultation without rales, wheezing or rhonchi  ABDOMEN: Soft, non-tender, non-distended EXTREMITIES:  No edema; No deformity     ASSESSMENT AND PLAN: .    Assessment & Plan Abnormal EKG EKG today reveals NSR at 97 bpm, cannot rule out anterior infarct. EKG personally reviewed by me. There is some artifact but there are possible q waves in leads V3-4. She denies dyspnea or chest pain with exertion but has had episodes of significant tachycardia and palpitations with stress. EKG finding makes her very anxious due to family history of stroke and personal history of hyperlipidemia and hypertension. We will get coronary CTA for evaluation of ischemia. Will have her take Lopressor 100 mg 2 hours prior to CT and ivabradine 5 mg 1 hour prior to CT.  She also mention she may take Xanax.  Advised her to have a driver if she does.   Atherosclerosis/Cardiac Risk Assessment Recent screening exam revealed concern for abnormal blood flow in her legs. She also has elevated CRP. Symptoms occur down left leg that worsens with sitting or laying down suggest nerve pain. Potential atherosclerosis and risk factor management were discussed. Advised we can order a vascular test to check blood flow, however goals of care will be to focus on lifestyle modifications, including exercise, to improve blood flow, along with management of hypertension, hyperlipidemia, and hyperglycemia.  Hypertension   BP initially elevated but improved on my recheck.Reports  BP usually well-controlled with losartan. Office readings are higher than those taken at home. Normal creatinine on recent labs. No change in anti-hypertensive therapy today.   Hyperlipidemia   Lipid panel completed 05/18/23 with total cholesterol 159, triglycerides 81, HDL 49, LDL-C 95. Advised goal LDL will be 70 or lower if there is evidence of coronary calcification. Continue rosuvastatin  5 mg for now. Consider higher dose rosuvastatin  or  addition of adjunct agent if LDL remains above goal. Recommended that she focus on secondary prevention including heart healthy mostly plant based diet avoiding saturated fat, processed foods, simple carbohydrates, and sugar along with aiming for at least 150 minutes of moderate intensity exercise each week.   Tachycardia/Palpitations    She reports resting heart rate generally around 100 bpm with elevations into the 120s with exertion. Admits that anxiety contributes to her elevated HR. One recent episode of significantly elevated HR at 159 bpm during the birth of her first grandchild.  She reports feeling exhausted after HR slowed down.  No evidence of arrhythmia on smart watch.  Consider ZIO monitor if symptoms persist.         Disposition:3 months with me  Signed, Judy [place], NP-C

## 2024-01-01 ENCOUNTER — Other Ambulatory Visit (HOSPITAL_BASED_OUTPATIENT_CLINIC_OR_DEPARTMENT_OTHER): Payer: Self-pay | Admitting: Family

## 2024-01-01 DIAGNOSIS — E782 Mixed hyperlipidemia: Secondary | ICD-10-CM

## 2024-01-03 ENCOUNTER — Telehealth (HOSPITAL_COMMUNITY): Payer: Self-pay | Admitting: *Deleted

## 2024-01-03 ENCOUNTER — Other Ambulatory Visit (HOSPITAL_COMMUNITY): Payer: Self-pay | Admitting: *Deleted

## 2024-01-03 DIAGNOSIS — E119 Type 2 diabetes mellitus without complications: Secondary | ICD-10-CM

## 2024-01-03 DIAGNOSIS — Z01812 Encounter for preprocedural laboratory examination: Secondary | ICD-10-CM

## 2024-01-03 NOTE — Telephone Encounter (Signed)
 Patient returning call about her upcoming cardiac imaging study; pt verbalizes understanding of appt date/time, parking situation and where to check in, pre-test NPO status and medications ordered, and verified current allergies; name and call back number provided for further questions should they arise  Chase Copping RN Navigator Cardiac Imaging [facility] [phone] office [phone] cell  Patient to take 100mg  metoprolol  tartrate and 5mg  ivabradine  two hours prior to her cardiac CT scan. She is aware that if she take her Xanax then she will need a driver. Patient reports her husband is bringing her to the appt.

## 2024-01-03 NOTE — Telephone Encounter (Signed)
 Attempted to call patient regarding upcoming cardiac CT appointment. Left message on voicemail with name and callback number Kerri Peed RN Navigator Cardiac Imaging [facility] [phone] Office  Reminder to obtain labs prior to CT scan.

## 2024-01-04 ENCOUNTER — Ambulatory Visit (HOSPITAL_COMMUNITY)
Admission: RE | Admit: 2024-01-04 | Discharge: 2024-01-04 | Disposition: A | Discharge status: Home / Self Care | Source: Ambulatory Visit | Attending: Nurse Practitioner | Admitting: Nurse Practitioner

## 2024-01-04 DIAGNOSIS — I2510 Atherosclerotic heart disease of native coronary artery without angina pectoris: Secondary | ICD-10-CM | POA: Diagnosis not present

## 2024-01-04 DIAGNOSIS — R9431 Abnormal electrocardiogram [ECG] [EKG]: Secondary | ICD-10-CM | POA: Diagnosis present

## 2024-01-04 MED ORDER — METOPROLOL TARTRATE 5 MG/5ML IV SOLN: 10.0000 mg | Freq: Once | INTRAVENOUS | Status: DC | PRN

## 2024-01-04 MED ORDER — DILTIAZEM HCL 25 MG/5ML IV SOLN: 10.0000 mg | INTRAVENOUS | Status: DC | PRN

## 2024-01-04 MED ORDER — NITROGLYCERIN 0.4 MG SL SUBL
0.8000 mg | SUBLINGUAL_TABLET | Freq: Once | SUBLINGUAL | Status: AC
Start: 2024-01-04 — End: 2024-01-04
  Administered 2024-01-04: 0.8 mg via SUBLINGUAL

## 2024-01-04 MED ORDER — IOHEXOL 350 MG/ML SOLN
100.0000 mL | Freq: Once | INTRAVENOUS | Status: AC | PRN
  Administered 2024-01-04: 100 mL via INTRAVENOUS

## 2024-01-05 ENCOUNTER — Ambulatory Visit (HOSPITAL_COMMUNITY)
Admission: RE | Admit: 2024-01-05 | Discharge: 2024-01-05 | Disposition: A | Discharge status: Home / Self Care | Source: Ambulatory Visit | Attending: Internal Medicine | Admitting: Internal Medicine

## 2024-01-05 ENCOUNTER — Other Ambulatory Visit: Payer: Self-pay | Admitting: Internal Medicine

## 2024-01-05 DIAGNOSIS — I2510 Atherosclerotic heart disease of native coronary artery without angina pectoris: Secondary | ICD-10-CM | POA: Diagnosis not present

## 2024-01-05 DIAGNOSIS — R931 Abnormal findings on diagnostic imaging of heart and coronary circulation: Secondary | ICD-10-CM | POA: Diagnosis present

## 2024-01-06 ENCOUNTER — Other Ambulatory Visit: Payer: Self-pay | Admitting: Physician Assistant

## 2024-01-06 DIAGNOSIS — I2510 Atherosclerotic heart disease of native coronary artery without angina pectoris: Secondary | ICD-10-CM

## 2024-01-06 DIAGNOSIS — R931 Abnormal findings on diagnostic imaging of heart and coronary circulation: Secondary | ICD-10-CM

## 2024-01-06 DIAGNOSIS — E782 Mixed hyperlipidemia: Secondary | ICD-10-CM

## 2024-01-06 MED ORDER — NITROGLYCERIN 0.4 MG SL SUBL: 0.4000 mg | SUBLINGUAL_TABLET | SUBLINGUAL | 3 refills | Status: AC | PRN

## 2024-01-06 MED ORDER — ROSUVASTATIN CALCIUM 20 MG PO TABS: 20.0000 mg | ORAL_TABLET | Freq: Every day | ORAL | 11 refills | Status: DC

## 2024-01-06 NOTE — Progress Notes (Signed)
 I was notified of FFR positive LAD stenosis on CT coronary.  I was able to speak with the patient and confirm she is not having angina.  I was able to schedule her for a 1 PM clinic visit on Tuesday, May 27 to discuss heart catheterization.  I am hopeful we can get her on the schedule either Wednesday or Thursday of next week.  I have sent in 20 mg Crestor  and SL NTG.  I advised calling our office to speak with the on-call provider should she have chest pain requiring nitroglycerin .  All questions answered.  She will be seen on Tuesday to discuss further steps.

## 2024-01-08 ENCOUNTER — Ambulatory Visit (HOSPITAL_BASED_OUTPATIENT_CLINIC_OR_DEPARTMENT_OTHER): Payer: Self-pay | Admitting: Family

## 2024-01-09 ENCOUNTER — Ambulatory Visit (INDEPENDENT_AMBULATORY_CARE_PROVIDER_SITE_OTHER): Admitting: Family

## 2024-01-09 ENCOUNTER — Encounter (HOSPITAL_BASED_OUTPATIENT_CLINIC_OR_DEPARTMENT_OTHER): Payer: Self-pay | Admitting: Family

## 2024-01-09 ENCOUNTER — Ambulatory Visit (HOSPITAL_BASED_OUTPATIENT_CLINIC_OR_DEPARTMENT_OTHER): Payer: Self-pay | Admitting: Family

## 2024-01-09 VITALS — BP 132/80 | HR 100 | Ht 65.0 in | Wt 188.8 lb

## 2024-01-09 DIAGNOSIS — I10 Essential (primary) hypertension: Secondary | ICD-10-CM

## 2024-01-09 DIAGNOSIS — I2510 Atherosclerotic heart disease of native coronary artery without angina pectoris: Secondary | ICD-10-CM | POA: Diagnosis not present

## 2024-01-09 DIAGNOSIS — E785 Hyperlipidemia, unspecified: Secondary | ICD-10-CM | POA: Diagnosis not present

## 2024-01-09 LAB — BASIC METABOLIC PANEL WITH GFR
BUN/Creatinine Ratio: 20 (ref 12–28)
BUN: 15 mg/dL (ref 8–27)
CO2: 26 mmol/L (ref 20–29)
Calcium: 9.8 mg/dL (ref 8.7–10.3)
Chloride: 99 mmol/L (ref 96–106)
Creatinine, Ser: 0.75 mg/dL (ref 0.57–1.00)
Glucose: 102 mg/dL — ABNORMAL HIGH (ref 70–99)
Potassium: 4.7 mmol/L (ref 3.5–5.2)
Sodium: 139 mmol/L (ref 134–144)
eGFR: 91 mL/min/{1.73_m2} (ref >59)

## 2024-01-09 LAB — CBC
Hematocrit: 48 % — ABNORMAL HIGH (ref 34.0–46.6)
Hemoglobin: 15.9 g/dL (ref 11.1–15.9)
MCH: 30.9 pg (ref 26.6–33.0)
MCHC: 33.1 g/dL (ref 31.5–35.7)
MCV: 93 fL (ref 79–97)
Platelets: 206 10*3/uL (ref 150–450)
RBC: 5.15 x10E6/uL (ref 3.77–5.28)
RDW: 14.1 % (ref 11.7–15.4)
WBC: 10.8 10*3/uL (ref 3.4–10.8)

## 2024-01-09 NOTE — Patient Instructions (Signed)
 Medication Instructions:  Your physician has recommended you make the following change in your medication:   Start: Aspirin 81mg  daily   Lab Work: BMP &CBC today   Testing/Procedures:  You are scheduled for a Cardiac Catheterization on Wednesday, May 28 with Dr. Arnoldo Lapping.  1. Please arrive at the [facility] (Main Entrance A) at [facility]: [address] [place], [place] [address] at 2:00 PM (This time is 2 hour(s) before your procedure to ensure your preparation).   Free valet parking service is available. You will check in at ADMITTING. The support person will be asked to wait in the waiting room.  It is OK to have someone drop you off and come back when you are ready to be discharged.    Special note: Every effort is made to have your procedure done on time. Please understand that emergencies sometimes delay scheduled procedures.  2. Diet: Do not eat solid foods after midnight.  The patient may have clear liquids until 5am upon the day of the procedure.  3. Labs: DONE AT OV   4. Medication instructions in preparation for your procedure:  On the morning of your procedure, take your Aspirin 81 mg and any morning medicines NOT listed above.  You may use sips of water.  5. Plan to go home the same day, you will only stay overnight if medically necessary. 6. Bring a current list of your medications and current insurance cards. 7. You MUST have a responsible person to drive you home. 8. Someone MUST be with you the first 24 hours after you arrive home or your discharge will be delayed. 9. Please wear clothes that are easy to get on and off and wear slip-on shoes.   Follow-Up:  As scheduled

## 2024-01-09 NOTE — Progress Notes (Signed)
 Cardiology Office Note:  .   Date:  01/09/2024  ID:  Judy Jones, DOB [birth year], MRN [id number] PCP: Rae Bugler, MD  [facility] Providers Cardiologist:  Maudine Sos, MD    History of Present Illness: .   Judy Jones is a 58 y.o. female with history of CAD, HTN, HLD, GERD, CKD, diet-controlled diabetes, palpitations, arthritis.  Family history of heart disease with mother with atrial fibrillation and sister with fibrillation and stroke.  Established in 2022 for palpitations and tachycardia which improved with stress reduction.  BP controlled on losartan, hydrochlorothiazide. Myoview  04/2020 LVEF 80%, no ischemia.   Last seen 4/30/25with EKG with new Q wave in leads V3-4. No chest pain but did not episodes of palpitations. Had lifeline screening with concern for PAD but no claudication symptoms, recommended to continue statin and walking regimen. Cardiac CTA 01/04/24 with calcium  score 115 placing her in 90th percentile with severe stenosis of ostial LAD (70-99% stenosis) with otherwise nonobstructive disease (LM <25%, mid LAD 50-69%, LCx 50-69%, RCA 25-49%). LAD stenosis significant by FFR.   Discussed the use of AI scribe software for clinical note transcription with the patient, who gave verbal consent to proceed.  History of Present Illness [place] is a 58 year old female presents today for follow up after CTA with her husband. Reviewed CTA results in detail. Of note, she has upcoming trip to [place] scheduled for 01/17/24 and if cancelling, will need to do so by 01/12/24.   Notes episodes of midsternal chest pain which she previously attributed to anxiety relieved by PRN Xanax.  Differentiating between anxiety-induced symptoms and cardiac symptoms is challenging. She reports no exertional dyspnea.   She is on rosuvastatin  for cholesterol management,  dose increased over the weekend based on CTA results, reviewed aiming for an LDL of less than 70.  Blood pressure is generally well-controlled at home, though higher in medical settings due to anxiety.    ROS: Please see the history of present illness.    All other systems reviewed and are negative.   Studies Reviewed: Aaron Aas   EKG Interpretation Date/Time:  Tuesday Jan 09 2024 12:58:51 EDT Ventricular Rate:  100 PR Interval:  138 QRS Duration:  66 QT Interval:  326 QTC Calculation: 420 R Axis:   55  Text Interpretation: Normal sinus rhythm Cannot rule out anterior infarct, age undetermined Reconfirmed by Neomi Banks ([id number]) on 01/09/2024 1:17:01 PM    Cardiac Studies & Procedures   ______________________________________________________________________________________________   STRESS TESTS  MYOCARDIAL PERFUSION IMAGING 04/16/2020  Narrative  Nuclear stress EF: 80%.  The left ventricular ejection fraction is hyperdynamic (>65%).  There was no ST segment deviation noted during stress.  No T wave inversion was noted during stress.  The study is normal.  This is a low risk study.   ECHOCARDIOGRAM  ECHOCARDIOGRAM COMPLETE 03/25/2021  Narrative ECHOCARDIOGRAM REPORT    Patient Name:   Judy Jones Date of Exam: 03/25/2021 Medical Rec #:  [id number]            Height:       65.0 in Accession #:    [id number]           Weight:       191.0 lb Date of Birth:  [birth year]            BSA:          1.940 m Patient Age:    60 years  BP:           168/84 mmHg Patient Gender: F                    HR:           9 bpm. Exam Location:  [address]  Procedure: 2D Echo, Cardiac Doppler, Color Doppler and Strain Analysis  Indications:    R06.00 SOB  History:        Patient has no prior history of Echocardiogram examinations. Arrythmias:Tachycardia, Signs/Symptoms:Chest tightness; Risk Factors:Hypertension, HLD and Diabetes.  Sonographer:    Juventino Oppenheim RCS Referring Phys: [id number] TIFFANY [place]  IMPRESSIONS   1. Left ventricular ejection  fraction, by estimation, is >75%. The left ventricle has hyperdynamic function. The left ventricle has no regional wall motion abnormalities. Left ventricular diastolic parameters are consistent with Grade I diastolic dysfunction (impaired relaxation). The average left ventricular global longitudinal strain is -18.9 %. The global longitudinal strain is normal. 2. Right ventricular systolic function is normal. The right ventricular size is normal. 3. The mitral valve is normal in structure. No evidence of mitral valve regurgitation. No evidence of mitral stenosis. 4. The aortic valve is tricuspid. Aortic valve regurgitation is not visualized. No aortic stenosis is present. 5. The inferior vena cava is normal in size with greater than 50% respiratory variability, suggesting right atrial pressure of 3 mmHg.  FINDINGS Left Ventricle: Left ventricular ejection fraction, by estimation, is >75%. The left ventricle has hyperdynamic function. The left ventricle has no regional wall motion abnormalities. The average left ventricular global longitudinal strain is -18.9 %. The global longitudinal strain is normal. The left ventricular internal cavity size was normal in size. There is no left ventricular hypertrophy. Left ventricular diastolic parameters are consistent with Grade I diastolic dysfunction (impaired relaxation).  Right Ventricle: The right ventricular size is normal. Right ventricular systolic function is normal.  Left Atrium: Left atrial size was normal in size.  Right Atrium: Right atrial size was normal in size.  Pericardium: There is no evidence of pericardial effusion.  Mitral Valve: The mitral valve is normal in structure. No evidence of mitral valve regurgitation. No evidence of mitral valve stenosis.  Tricuspid Valve: The tricuspid valve is normal in structure. Tricuspid valve regurgitation is trivial. No evidence of tricuspid stenosis.  Aortic Valve: The aortic valve is tricuspid.  Aortic valve regurgitation is not visualized. No aortic stenosis is present.  Pulmonic Valve: The pulmonic valve was not well visualized. Pulmonic valve regurgitation is not visualized. No evidence of pulmonic stenosis.  Aorta: The aortic root is normal in size and structure.  Venous: The inferior vena cava is normal in size with greater than 50% respiratory variability, suggesting right atrial pressure of 3 mmHg.  IAS/Shunts: The interatrial septum was not well visualized.   LEFT VENTRICLE PLAX 2D LVIDd:         4.30 cm  Diastology LVIDs:         2.80 cm  LV e' medial:    5.77 cm/s LV PW:         0.80 cm  LV E/e' medial:  14.7 LV IVS:        0.80 cm  LV e' lateral:   11.10 cm/s LVOT diam:     2.00 cm  LV E/e' lateral: 7.6 LV SV:         75 LV SV Index:   39       2D Longitudinal Strain LVOT Area:  3.14 cm 2D Strain GLS (A2C):   -20.1 % 2D Strain GLS (A3C):   -19.2 % 2D Strain GLS (A4C):   17.4 % 2D Strain GLS Avg:     -18.9 %  RIGHT VENTRICLE RV Basal diam:  2.70 cm RV S prime:     13.10 cm/s TAPSE (M-mode): 2.2 cm  LEFT ATRIUM             Index       RIGHT ATRIUM          Index LA diam:        2.60 cm 1.34 cm/m  RA Area:     8.80 cm LA Vol (A2C):   30.4 ml 15.67 ml/m RA Volume:   16.00 ml 8.25 ml/m LA Vol (A4C):   19.3 ml 9.95 ml/m LA Biplane Vol: 25.6 ml 13.20 ml/m AORTIC VALVE LVOT Vmax:   102.00 cm/s LVOT Vmean:  66.600 cm/s LVOT VTI:    0.238 m  AORTA Ao Root diam: 2.80 cm Ao Asc diam:  3.40 cm  MITRAL VALVE MV Area (PHT):              SHUNTS MV Decel Time:              Systemic VTI:  0.24 m MV E velocity: 84.80 cm/s   Systemic Diam: 2.00 cm MV A velocity: 106.00 cm/s MV E/A ratio:  0.80  Alexandria Angel MD Electronically signed by Alexandria Angel MD Signature Date/Time: 03/25/2021/4:18:28 PM    Final      CT SCANS  CT CORONARY MORPH W/CTA COR W/SCORE 01/04/2024  Narrative CLINICAL DATA:  ECG abnormal, intermediate CAD  risk  EXAM: Cardiac/Coronary CTA  TECHNIQUE: A non-contrast, gated CT scan was obtained with axial slices of 2.5 mm through the heart for calcium  scoring. Calcium  scoring was performed using the Agatston method. A 120 kV prospective, gated, contrast cardiac CT scan was obtained. Gantry rotation speed was 230 msec and collimation was 0.63 mm. Two sublingual nitroglycerin  tablets (0.8 mg) were given. The 3D data set was reconstructed with motion correction for the best systolic or diastolic phase. Images were analyzed on a dedicated workstation using MPR, MIP, and VRT modes. The patient received 95 cc of contrast.  FINDINGS: Image quality: Average  Noise artifact is: Reduced SNR  Coronary calcium  score is 115, which places the patient in the 90th percentile for age and sex matched control.  Coronary arteries: Normal coronary origins.  Right dominance.  Right Coronary Artery: Mild plaque proximal and mid RCA, 25-49% stenosis.  Left Main Coronary Artery: Minimal plaque distal LM, <25% stenosis.  Left Anterior Descending Coronary Artery: Severe mixed plaque ostial LAD, 70-99% stenosis. Moderate plaque mid LAD 50-69% stenosis.  Left Circumflex Artery: Moderate plaque ostial LCx, 50-69% stenosis.  Aorta: Normal size, 32 mm at the mid ascending aorta (level of the PA bifurcation) measured double oblique.  Aortic Valve: No calcifications.  Other findings:  Normal pulmonary vein drainage into the left atrium.  Normal left atrial appendage without thrombus.  Normal size of the pulmonary artery.  Please see separate report from [facility] for non-cardiac findings.  IMPRESSION: 1. Severe CAD in the ostial LAD, 70-99% stenosis, CADRADS 4. CT FFR will be performed and reported separately.  2. Total plaque volume 365 mm3 (calcified plaque 44 mm3; non-calcified plaque 321 mm3), which is 84th percentile for age- and sex-matched controls. TPV is severe.  3. Coronary  calcium  score is 115, which places the patient in the  90th percentile for age and sex matched control.  4. Normal coronary origins with right dominance.  RECOMMENDATIONS: CAD-RADS 4 Severe stenosis. (70-99% or > 50% left main). Cardiac catheterization or CT FFR is recommended. Consider symptom-guided anti-ischemic pharmacotherapy as well as risk factor modification per guideline directed care.   Electronically Signed By: Grady Lawman M.D. On: 01/05/2024 23:15     ______________________________________________________________________________________________      Risk Assessment/Calculations:             Physical Exam:   VS:  BP 132/80 (BP Location: Left Arm, Patient Position: Sitting, Cuff Size: Normal)   Pulse 100   Ht 5\' 5"  (1.651 m)   Wt 188 lb 12.8 oz (85.6 kg)   LMP 01/13/2006   SpO2 93%   BMI 31.42 kg/m    Wt Readings from Last 3 Encounters:  01/09/24 188 lb 12.8 oz (85.6 kg)  12/13/23 189 lb 3.2 oz (85.8 kg)  05/22/23 191 lb (86.6 kg)    GEN: Well nourished, well developed in no acute distress NECK: No JVD; No carotid bruits CARDIAC: RRR, no murmurs, rubs, gallops RESPIRATORY:  Clear to auscultation without rales, wheezing or rhonchi  ABDOMEN: Soft, non-tender, non-distended EXTREMITIES:  No edema; No deformity   ASSESSMENT AND PLAN: .    Assessment & Plan Coronary artery disease with significant LAD stenosis Significant LAD stenosis with 70-99% proximal and 50-69% mid-segment stenosis with reduced FFR by CTA. Previous episodes of chest pain attributed to anxiety as relieved with PRN Xanax. Reports no exertional dyspnea.  - Cardiac catheterization risk/benefit reviewed, scheduled for tomorrow  -BMET, CBC, Lp(a) today - GDMT Aspirin 81mg  daily, Rosuvastatin  40mg  daily.  - Plan for lipid panel in 2 months to reassess LDL on higher dose Rosuvastatin , can coordinate at follow up  - Upcoming trip 01/17/24 to [place] which she must cancel by 01/12/24, discuss  based on results of [facility].   Hypertension Blood pressure generally well-controlled. Recent elevation due to anxiety during visits. Home monitoring shows better control. - Continue current antihypertensive medication Losartan-hydrochlorothiazide 100-12.5mg  daily. - Encourage regular home blood pressure monitoring.  Anxiety with panic attacks Continue to follow with PCP. Continue Xanax as needed for anxiety management.      Informed Consent   Shared Decision Making/Informed Consent The risks [stroke (1 in 1000), death (1 in 1000), kidney failure [usually temporary] (1 in 500), bleeding (1 in 200), allergic reaction [possibly serious] (1 in 200)], benefits (diagnostic support and management of coronary artery disease) and alternatives of a cardiac catheterization were discussed in detail with Ms. Gilberti and she is willing to proceed.     Dispo: [facility] 01/10/24 and follow up in clinic 01/15/24 as scheduled  Signed, Clearnce Curia, NP

## 2024-01-09 NOTE — H&P (View-Only) (Signed)
 Cardiology Office Note:  .   Date:  01/09/2024  ID:  Judy Jones, DOB [birth year], MRN [id number] PCP: Rae Bugler, MD  [facility] Providers Cardiologist:  Maudine Sos, MD    History of Present Illness: .   Judy Jones is a 58 y.o. female with history of CAD, HTN, HLD, GERD, CKD, diet-controlled diabetes, palpitations, arthritis.  Family history of heart disease with mother with atrial fibrillation and sister with fibrillation and stroke.  Established in 2022 for palpitations and tachycardia which improved with stress reduction.  BP controlled on losartan, hydrochlorothiazide. Myoview  04/2020 LVEF 80%, no ischemia.   Last seen 4/30/25with EKG with new Q wave in leads V3-4. No chest pain but did not episodes of palpitations. Had lifeline screening with concern for PAD but no claudication symptoms, recommended to continue statin and walking regimen. Cardiac CTA 01/04/24 with calcium  score 115 placing her in 90th percentile with severe stenosis of ostial LAD (70-99% stenosis) with otherwise nonobstructive disease (LM <25%, mid LAD 50-69%, LCx 50-69%, RCA 25-49%). LAD stenosis significant by FFR.   Discussed the use of AI scribe software for clinical note transcription with the patient, who gave verbal consent to proceed.  History of Present Illness Judy Jones is a 58 year old female presents today for follow up after CTA with her husband. Reviewed CTA results in detail. Of note, she has upcoming trip to [place] scheduled for 01/17/24 and if cancelling, will need to do so by 01/12/24.   Notes episodes of midsternal chest pain which she previously attributed to anxiety relieved by PRN Xanax.  Differentiating between anxiety-induced symptoms and cardiac symptoms is challenging. She reports no exertional dyspnea.   She is on rosuvastatin  for cholesterol management,  dose increased over the weekend based on CTA results, reviewed aiming for an LDL of less than 70.  Blood pressure is generally well-controlled at home, though higher in medical settings due to anxiety.    ROS: Please see the history of present illness.    All other systems reviewed and are negative.   Studies Reviewed: Aaron Aas   EKG Interpretation Date/Time:  Tuesday Jan 09 2024 12:58:51 EDT Ventricular Rate:  100 PR Interval:  138 QRS Duration:  66 QT Interval:  326 QTC Calculation: 420 R Axis:   55  Text Interpretation: Normal sinus rhythm Cannot rule out anterior infarct, age undetermined Reconfirmed by Neomi Banks ([id number]) on 01/09/2024 1:17:01 PM    Cardiac Studies & Procedures   ______________________________________________________________________________________________   STRESS TESTS  MYOCARDIAL PERFUSION IMAGING 04/16/2020  Narrative  Nuclear stress EF: 80%.  The left ventricular ejection fraction is hyperdynamic (>65%).  There was no ST segment deviation noted during stress.  No T wave inversion was noted during stress.  The study is normal.  This is a low risk study.   ECHOCARDIOGRAM  ECHOCARDIOGRAM COMPLETE 03/25/2021  Narrative ECHOCARDIOGRAM REPORT    Patient Name:   Judy Jones Date of Exam: 03/25/2021 Medical Rec #:  [id number]            Height:       65.0 in Accession #:    [id number]           Weight:       191.0 lb Date of Birth:  [birth year]            BSA:          1.940 m Patient Age:    60 years  BP:           168/84 mmHg Patient Gender: F                    HR:           9 bpm. Exam Location:  [address]  Procedure: 2D Echo, Cardiac Doppler, Color Doppler and Strain Analysis  Indications:    R06.00 SOB  History:        Patient has no prior history of Echocardiogram examinations. Arrythmias:Tachycardia, Signs/Symptoms:Chest tightness; Risk Factors:Hypertension, HLD and Diabetes.  Sonographer:    Juventino Oppenheim RCS Referring Phys: [id number] TIFFANY [place]  IMPRESSIONS   1. Left ventricular ejection  fraction, by estimation, is >75%. The left ventricle has hyperdynamic function. The left ventricle has no regional wall motion abnormalities. Left ventricular diastolic parameters are consistent with Grade I diastolic dysfunction (impaired relaxation). The average left ventricular global longitudinal strain is -18.9 %. The global longitudinal strain is normal. 2. Right ventricular systolic function is normal. The right ventricular size is normal. 3. The mitral valve is normal in structure. No evidence of mitral valve regurgitation. No evidence of mitral stenosis. 4. The aortic valve is tricuspid. Aortic valve regurgitation is not visualized. No aortic stenosis is present. 5. The inferior vena cava is normal in size with greater than 50% respiratory variability, suggesting right atrial pressure of 3 mmHg.  FINDINGS Left Ventricle: Left ventricular ejection fraction, by estimation, is >75%. The left ventricle has hyperdynamic function. The left ventricle has no regional wall motion abnormalities. The average left ventricular global longitudinal strain is -18.9 %. The global longitudinal strain is normal. The left ventricular internal cavity size was normal in size. There is no left ventricular hypertrophy. Left ventricular diastolic parameters are consistent with Grade I diastolic dysfunction (impaired relaxation).  Right Ventricle: The right ventricular size is normal. Right ventricular systolic function is normal.  Left Atrium: Left atrial size was normal in size.  Right Atrium: Right atrial size was normal in size.  Pericardium: There is no evidence of pericardial effusion.  Mitral Valve: The mitral valve is normal in structure. No evidence of mitral valve regurgitation. No evidence of mitral valve stenosis.  Tricuspid Valve: The tricuspid valve is normal in structure. Tricuspid valve regurgitation is trivial. No evidence of tricuspid stenosis.  Aortic Valve: The aortic valve is tricuspid.  Aortic valve regurgitation is not visualized. No aortic stenosis is present.  Pulmonic Valve: The pulmonic valve was not well visualized. Pulmonic valve regurgitation is not visualized. No evidence of pulmonic stenosis.  Aorta: The aortic root is normal in size and structure.  Venous: The inferior vena cava is normal in size with greater than 50% respiratory variability, suggesting right atrial pressure of 3 mmHg.  IAS/Shunts: The interatrial septum was not well visualized.   LEFT VENTRICLE PLAX 2D LVIDd:         4.30 cm  Diastology LVIDs:         2.80 cm  LV e' medial:    5.77 cm/s LV PW:         0.80 cm  LV E/e' medial:  14.7 LV IVS:        0.80 cm  LV e' lateral:   11.10 cm/s LVOT diam:     2.00 cm  LV E/e' lateral: 7.6 LV SV:         75 LV SV Index:   39       2D Longitudinal Strain LVOT Area:  3.14 cm 2D Strain GLS (A2C):   -20.1 % 2D Strain GLS (A3C):   -19.2 % 2D Strain GLS (A4C):   17.4 % 2D Strain GLS Avg:     -18.9 %  RIGHT VENTRICLE RV Basal diam:  2.70 cm RV S prime:     13.10 cm/s TAPSE (M-mode): 2.2 cm  LEFT ATRIUM             Index       RIGHT ATRIUM          Index LA diam:        2.60 cm 1.34 cm/m  RA Area:     8.80 cm LA Vol (A2C):   30.4 ml 15.67 ml/m RA Volume:   16.00 ml 8.25 ml/m LA Vol (A4C):   19.3 ml 9.95 ml/m LA Biplane Vol: 25.6 ml 13.20 ml/m AORTIC VALVE LVOT Vmax:   102.00 cm/s LVOT Vmean:  66.600 cm/s LVOT VTI:    0.238 m  AORTA Ao Root diam: 2.80 cm Ao Asc diam:  3.40 cm  MITRAL VALVE MV Area (PHT):              SHUNTS MV Decel Time:              Systemic VTI:  0.24 m MV E velocity: 84.80 cm/s   Systemic Diam: 2.00 cm MV A velocity: 106.00 cm/s MV E/A ratio:  0.80  Alexandria Angel MD Electronically signed by Alexandria Angel MD Signature Date/Time: 03/25/2021/4:18:28 PM    Final      CT SCANS  CT CORONARY MORPH W/CTA COR W/SCORE 01/04/2024  Narrative CLINICAL DATA:  ECG abnormal, intermediate CAD  risk  EXAM: Cardiac/Coronary CTA  TECHNIQUE: A non-contrast, gated CT scan was obtained with axial slices of 2.5 mm through the heart for calcium  scoring. Calcium  scoring was performed using the Agatston method. A 120 kV prospective, gated, contrast cardiac CT scan was obtained. Gantry rotation speed was 230 msec and collimation was 0.63 mm. Two sublingual nitroglycerin  tablets (0.8 mg) were given. The 3D data set was reconstructed with motion correction for the best systolic or diastolic phase. Images were analyzed on a dedicated workstation using MPR, MIP, and VRT modes. The patient received 95 cc of contrast.  FINDINGS: Image quality: Average  Noise artifact is: Reduced SNR  Coronary calcium  score is 115, which places the patient in the 90th percentile for age and sex matched control.  Coronary arteries: Normal coronary origins.  Right dominance.  Right Coronary Artery: Mild plaque proximal and mid RCA, 25-49% stenosis.  Left Main Coronary Artery: Minimal plaque distal LM, <25% stenosis.  Left Anterior Descending Coronary Artery: Severe mixed plaque ostial LAD, 70-99% stenosis. Moderate plaque mid LAD 50-69% stenosis.  Left Circumflex Artery: Moderate plaque ostial LCx, 50-69% stenosis.  Aorta: Normal size, 32 mm at the mid ascending aorta (level of the PA bifurcation) measured double oblique.  Aortic Valve: No calcifications.  Other findings:  Normal pulmonary vein drainage into the left atrium.  Normal left atrial appendage without thrombus.  Normal size of the pulmonary artery.  Please see separate report from [facility] for non-cardiac findings.  IMPRESSION: 1. Severe CAD in the ostial LAD, 70-99% stenosis, CADRADS 4. CT FFR will be performed and reported separately.  2. Total plaque volume 365 mm3 (calcified plaque 44 mm3; non-calcified plaque 321 mm3), which is 84th percentile for age- and sex-matched controls. TPV is severe.  3. Coronary  calcium  score is 115, which places the patient in the  90th percentile for age and sex matched control.  4. Normal coronary origins with right dominance.  RECOMMENDATIONS: CAD-RADS 4 Severe stenosis. (70-99% or > 50% left main). Cardiac catheterization or CT FFR is recommended. Consider symptom-guided anti-ischemic pharmacotherapy as well as risk factor modification per guideline directed care.   Electronically Signed By: Grady Lawman M.D. On: 01/05/2024 23:15     ______________________________________________________________________________________________      Risk Assessment/Calculations:             Physical Exam:   VS:  BP 132/80 (BP Location: Left Arm, Patient Position: Sitting, Cuff Size: Normal)   Pulse 100   Ht 5\' 5"  (1.651 m)   Wt 188 lb 12.8 oz (85.6 kg)   LMP 01/13/2006   SpO2 93%   BMI 31.42 kg/m    Wt Readings from Last 3 Encounters:  01/09/24 188 lb 12.8 oz (85.6 kg)  12/13/23 189 lb 3.2 oz (85.8 kg)  05/22/23 191 lb (86.6 kg)    GEN: Well nourished, well developed in no acute distress NECK: No JVD; No carotid bruits CARDIAC: RRR, no murmurs, rubs, gallops RESPIRATORY:  Clear to auscultation without rales, wheezing or rhonchi  ABDOMEN: Soft, non-tender, non-distended EXTREMITIES:  No edema; No deformity   ASSESSMENT AND PLAN: .    Assessment & Plan Coronary artery disease with significant LAD stenosis Significant LAD stenosis with 70-99% proximal and 50-69% mid-segment stenosis with reduced FFR by CTA. Previous episodes of chest pain attributed to anxiety as relieved with PRN Xanax. Reports no exertional dyspnea.  - Cardiac catheterization risk/benefit reviewed, scheduled for tomorrow  -BMET, CBC, Lp(a) today - GDMT Aspirin 81mg  daily, Rosuvastatin  40mg  daily.  - Plan for lipid panel in 2 months to reassess LDL on higher dose Rosuvastatin , can coordinate at follow up  - Upcoming trip 01/17/24 to [place] which she must cancel by 01/12/24, discuss  based on results of LHC.   Hypertension Blood pressure generally well-controlled. Recent elevation due to anxiety during visits. Home monitoring shows better control. - Continue current antihypertensive medication Losartan-hydrochlorothiazide 100-12.5mg  daily. - Encourage regular home blood pressure monitoring.  Anxiety with panic attacks Continue to follow with PCP. Continue Xanax as needed for anxiety management.      Informed Consent   Shared Decision Making/Informed Consent The risks [stroke (1 in 1000), death (1 in 1000), kidney failure [usually temporary] (1 in 500), bleeding (1 in 200), allergic reaction [possibly serious] (1 in 200)], benefits (diagnostic support and management of coronary artery disease) and alternatives of a cardiac catheterization were discussed in detail with Ms. Gilberti and she is willing to proceed.     Dispo: LHC 01/10/24 and follow up in clinic 01/15/24 as scheduled  Signed, Clearnce Curia, NP

## 2024-01-10 ENCOUNTER — Other Ambulatory Visit: Payer: Self-pay

## 2024-01-10 ENCOUNTER — Encounter (HOSPITAL_COMMUNITY)
Admission: RE | Disposition: A | Discharge status: Home / Self Care | Payer: Self-pay | Source: Home / Self Care | Attending: Cardiovascular Disease

## 2024-01-10 ENCOUNTER — Ambulatory Visit (HOSPITAL_COMMUNITY)
Admission: RE | Admit: 2024-01-10 | Discharge: 2024-01-10 | Disposition: A | Discharge status: Home / Self Care | Attending: Cardiovascular Disease | Admitting: Cardiovascular Disease

## 2024-01-10 DIAGNOSIS — F410 Panic disorder [episodic paroxysmal anxiety] without agoraphobia: Secondary | ICD-10-CM | POA: Diagnosis not present

## 2024-01-10 DIAGNOSIS — I129 Hypertensive chronic kidney disease with stage 1 through stage 4 chronic kidney disease, or unspecified chronic kidney disease: Secondary | ICD-10-CM | POA: Diagnosis not present

## 2024-01-10 DIAGNOSIS — R0602 Shortness of breath: Secondary | ICD-10-CM | POA: Diagnosis not present

## 2024-01-10 DIAGNOSIS — N189 Chronic kidney disease, unspecified: Secondary | ICD-10-CM | POA: Insufficient documentation

## 2024-01-10 DIAGNOSIS — R931 Abnormal findings on diagnostic imaging of heart and coronary circulation: Secondary | ICD-10-CM | POA: Diagnosis present

## 2024-01-10 DIAGNOSIS — Z8249 Family history of ischemic heart disease and other diseases of the circulatory system: Secondary | ICD-10-CM | POA: Diagnosis not present

## 2024-01-10 DIAGNOSIS — Z79899 Other long term (current) drug therapy: Secondary | ICD-10-CM | POA: Diagnosis not present

## 2024-01-10 DIAGNOSIS — I2510 Atherosclerotic heart disease of native coronary artery without angina pectoris: Secondary | ICD-10-CM | POA: Insufficient documentation

## 2024-01-10 DIAGNOSIS — E1122 Type 2 diabetes mellitus with diabetic chronic kidney disease: Secondary | ICD-10-CM | POA: Diagnosis not present

## 2024-01-10 HISTORY — PX: LEFT HEART CATH AND CORONARY ANGIOGRAPHY: CATH118249

## 2024-01-10 SURGERY — LEFT HEART CATH AND CORONARY ANGIOGRAPHY
Anesthesia: LOCAL

## 2024-01-10 MED ORDER — HEPARIN (PORCINE) IN NACL 1000-0.9 UT/500ML-% IV SOLN
INTRAVENOUS | Status: DC | PRN
  Administered 2024-01-10: 1000 mL

## 2024-01-10 MED ORDER — MIDAZOLAM HCL 2 MG/2ML IJ SOLN
INTRAMUSCULAR | Status: AC
  Filled 2024-01-10: qty 2

## 2024-01-10 MED ORDER — FENTANYL CITRATE (PF) 100 MCG/2ML IJ SOLN
INTRAMUSCULAR | Status: DC | PRN
  Administered 2024-01-10: 25 ug via INTRAVENOUS

## 2024-01-10 MED ORDER — HEPARIN SODIUM (PORCINE) 1000 UNIT/ML IJ SOLN
INTRAMUSCULAR | Status: AC
Start: 2024-01-10 — End: ?
  Filled 2024-01-10: qty 10

## 2024-01-10 MED ORDER — SODIUM CHLORIDE 0.9% FLUSH
3.0000 mL | INTRAVENOUS | Status: DC | PRN
Start: 2024-01-10 — End: 2024-01-10

## 2024-01-10 MED ORDER — SODIUM CHLORIDE 0.9% FLUSH: 3.0000 mL | Freq: Two times a day (BID) | INTRAVENOUS | Status: DC

## 2024-01-10 MED ORDER — ONDANSETRON HCL 4 MG/2ML IJ SOLN
INTRAMUSCULAR | Status: AC
Start: 2024-01-10 — End: ?
  Filled 2024-01-10: qty 2

## 2024-01-10 MED ORDER — SODIUM CHLORIDE 0.9 % WEIGHT BASED INFUSION: 1.0000 mL/kg/h | INTRAVENOUS | Status: DC

## 2024-01-10 MED ORDER — IOHEXOL 350 MG/ML SOLN
INTRAVENOUS | Status: DC | PRN
  Administered 2024-01-10: 45 mL

## 2024-01-10 MED ORDER — ASPIRIN 81 MG PO CHEW: 81.0000 mg | CHEWABLE_TABLET | ORAL | Status: DC

## 2024-01-10 MED ORDER — HEPARIN (PORCINE) IN NACL 2-0.9 UNITS/ML
INTRAMUSCULAR | Status: DC | PRN
  Administered 2024-01-10: 10 mL via INTRA_ARTERIAL

## 2024-01-10 MED ORDER — LIDOCAINE HCL (PF) 1 % IJ SOLN
INTRAMUSCULAR | Status: DC | PRN
  Administered 2024-01-10: 3 mL

## 2024-01-10 MED ORDER — SODIUM CHLORIDE 0.9 % WEIGHT BASED INFUSION: 3.0000 mL/kg/h | INTRAVENOUS | Status: AC

## 2024-01-10 MED ORDER — MIDAZOLAM HCL 2 MG/2ML IJ SOLN
INTRAMUSCULAR | Status: DC | PRN
  Administered 2024-01-10: 2 mg via INTRAVENOUS

## 2024-01-10 MED ORDER — LABETALOL HCL 5 MG/ML IV SOLN: 10.0000 mg | INTRAVENOUS | Status: DC | PRN

## 2024-01-10 MED ORDER — ONDANSETRON HCL 4 MG/2ML IJ SOLN
INTRAMUSCULAR | Status: DC | PRN
  Administered 2024-01-10: 4 mg via INTRAVENOUS

## 2024-01-10 MED ORDER — HYDRALAZINE HCL 20 MG/ML IJ SOLN: 10.0000 mg | INTRAMUSCULAR | Status: DC | PRN

## 2024-01-10 MED ORDER — SODIUM CHLORIDE 0.9 % IV SOLN
INTRAVENOUS | Status: DC
Start: 2024-01-10 — End: 2024-01-10

## 2024-01-10 MED ORDER — LIDOCAINE HCL (PF) 1 % IJ SOLN
INTRAMUSCULAR | Status: AC
  Filled 2024-01-10: qty 30

## 2024-01-10 MED ORDER — VERAPAMIL HCL 2.5 MG/ML IV SOLN
INTRAVENOUS | Status: AC
  Filled 2024-01-10: qty 2

## 2024-01-10 MED ORDER — FENTANYL CITRATE (PF) 100 MCG/2ML IJ SOLN
INTRAMUSCULAR | Status: AC
  Filled 2024-01-10: qty 2

## 2024-01-10 MED ORDER — HEPARIN SODIUM (PORCINE) 1000 UNIT/ML IJ SOLN
INTRAMUSCULAR | Status: DC | PRN
  Administered 2024-01-10: 5000 [IU] via INTRAVENOUS

## 2024-01-10 MED ORDER — SODIUM CHLORIDE 0.9 % IV SOLN
250.0000 mL | INTRAVENOUS | Status: DC | PRN
Start: 2024-01-10 — End: 2024-01-10

## 2024-01-10 SURGICAL SUPPLY — 6 items
CATH 5FR JL3.5 JR4 ANG PIG MP (CATHETERS) IMPLANT
DEVICE RAD COMP TR BAND LRG (VASCULAR PRODUCTS) IMPLANT
GLIDESHEATH SLEND SS 6F .021 (SHEATH) IMPLANT
GUIDEWIRE INQWIRE 1.5J.035X260 (WIRE) IMPLANT
PACK CARDIAC CATHETERIZATION (CUSTOM PROCEDURE TRAY) ×1 IMPLANT
SET ATX-X65L (MISCELLANEOUS) IMPLANT

## 2024-01-10 NOTE — Progress Notes (Signed)
 Tr band removed at 1815, gauze dressing applied. Right radial level 0, clean, dry, and intact.

## 2024-01-10 NOTE — Discharge Instructions (Signed)

## 2024-01-10 NOTE — Interval H&P Note (Signed)
 History and Physical Interval Note:  01/10/2024 3:28 PM  Judy Jones Judy Jones  has presented today for surgery, with the diagnosis of cad.  The various methods of treatment have been discussed with the patient and family. After consideration of risks, benefits and other options for treatment, the patient has consented to  Procedure(s): LEFT HEART CATH AND CORONARY ANGIOGRAPHY (N/A) as a surgical intervention.  The patient's history has been reviewed, patient examined, no change in status, stable for surgery.  I have reviewed the patient's chart and labs.  Questions were answered to the patient's satisfaction.     Arnoldo Lapping

## 2024-01-11 ENCOUNTER — Encounter (HOSPITAL_BASED_OUTPATIENT_CLINIC_OR_DEPARTMENT_OTHER): Payer: Self-pay

## 2024-01-11 ENCOUNTER — Encounter (HOSPITAL_COMMUNITY): Payer: Self-pay | Admitting: Cardiovascular Disease

## 2024-01-11 NOTE — Telephone Encounter (Signed)
 Called to review as Mychart message not read. She plans to proceed with her trip.   Reviewed extraluminal vs intraluminal plaque. Will discuss further at OV Monday. Notes R radial cath site is sore but otherwise well.   Aleathia Purdy S Yuritzy Zehring, NP

## 2024-01-12 LAB — BASIC METABOLIC PANEL WITH GFR
BUN/Creatinine Ratio: 18 (ref 12–28)
BUN: 17 mg/dL (ref 8–27)
CO2: 17 mmol/L — ABNORMAL LOW (ref 20–29)
Calcium: 10.4 mg/dL — ABNORMAL HIGH (ref 8.7–10.3)
Chloride: 100 mmol/L (ref 96–106)
Creatinine, Ser: 0.93 mg/dL (ref 0.57–1.00)
Glucose: 107 mg/dL — ABNORMAL HIGH (ref 70–99)
Potassium: 5 mmol/L (ref 3.5–5.2)
Sodium: 140 mmol/L (ref 134–144)
eGFR: 70 mL/min/{1.73_m2} (ref >59)

## 2024-01-12 LAB — CBC

## 2024-01-12 LAB — LIPOPROTEIN A (LPA)

## 2024-01-15 ENCOUNTER — Ambulatory Visit (INDEPENDENT_AMBULATORY_CARE_PROVIDER_SITE_OTHER): Admitting: Nurse Practitioner

## 2024-01-15 ENCOUNTER — Ambulatory Visit (HOSPITAL_BASED_OUTPATIENT_CLINIC_OR_DEPARTMENT_OTHER): Admitting: Family

## 2024-01-15 ENCOUNTER — Encounter (HOSPITAL_BASED_OUTPATIENT_CLINIC_OR_DEPARTMENT_OTHER): Payer: Self-pay | Admitting: Nurse Practitioner

## 2024-01-15 ENCOUNTER — Other Ambulatory Visit (HOSPITAL_BASED_OUTPATIENT_CLINIC_OR_DEPARTMENT_OTHER): Payer: Self-pay | Admitting: *Deleted

## 2024-01-15 VITALS — BP 132/76 | HR 89 | Ht 65.0 in | Wt 189.0 lb

## 2024-01-15 DIAGNOSIS — E785 Hyperlipidemia, unspecified: Secondary | ICD-10-CM

## 2024-01-15 DIAGNOSIS — R000 Tachycardia, unspecified: Secondary | ICD-10-CM

## 2024-01-15 DIAGNOSIS — I2510 Atherosclerotic heart disease of native coronary artery without angina pectoris: Secondary | ICD-10-CM | POA: Diagnosis not present

## 2024-01-15 DIAGNOSIS — I10 Essential (primary) hypertension: Secondary | ICD-10-CM | POA: Diagnosis not present

## 2024-01-15 DIAGNOSIS — R002 Palpitations: Secondary | ICD-10-CM

## 2024-01-15 NOTE — Patient Instructions (Signed)
 Medication Instructions:   Your physician recommends that you continue on your current medications as directed. Please refer to the Current Medication list given to you today.   *If you need a refill on your cardiac medications before your next appointment, please call your pharmacy*  Lab Work:  Your physician recommends that you return for a FASTING lipid profile/LPA/NMR, fasting after midnight, one week prior to your upcoming appointment with Manuella Seller. Paperwork given to patient today.    If you have labs (blood work) drawn today and your tests are completely normal, you will receive your results only by: MyChart Message (if you have MyChart) OR A paper copy in the mail If you have any lab test that is abnormal or we need to change your treatment, we will call you to review the results.  Testing/Procedures:  None ordered.  Follow-Up: At [facility], you and your health needs are our priority.  As part of our continuing mission to provide you with exceptional heart care, our providers are all part of one team.  This team includes your primary Cardiologist (physician) and Advanced Practice Providers or APPs (Physician Assistants and Nurse Practitioners) who all work together to provide you with the care you need, when you need it.  Your next appointment:   2 month(s)  Provider:   Slater Duncan, NP    We recommend signing up for the patient portal called "MyChart".  Sign up information is provided on this After Visit Summary.  MyChart is used to connect with patients for Virtual Visits (Telemedicine).  Patients are able to view lab/test results, encounter notes, upcoming appointments, etc.  Non-urgent messages can be sent to your provider as well.   To learn more about what you can do with MyChart, go to [email or web address].

## 2024-01-15 NOTE — Progress Notes (Signed)
 Cardiology Office Note:  .   Date:  01/15/2024  ID:  Judy Jones, DOB [birth year], MRN [id number] PCP: Judy Bugler, MD  [facility] Providers Cardiologist:  Judy Sos, MD    Patient Profile: .      PMH Hypertension Hyperlipidemia GERD Anxiety Diet controlled diabetes Palpitations Arthritis Family history of heart disease Mother - atrial fibrillation Sister - atrial fibrillation and stroke  She established care in 2022 for palpitations and tachycardia which improved when prior stressors improved. BP was well-controlled on losartan and hydrochlorothiazide at that time.  Nuclear stress test 04/2020 with LVEF 80% and no ischemia.  Seen 10/28/2022 by Judy Banks, NP for independent follow-up.  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 Palpitations  well controlled, only noted with increased caffeine or stress.  She was interested in being more active and remains understandably concerned about palpitations as both her mother and sister have atrial fibrillation.  She does wear a smart watch that has not alerted to A-fib.  Most recent LDL was 96 which is at goal of <100.  Advised that if palpitations worsened would plan for ZIO monitor.  BMI was 32 and she was encouraged to work on weight loss via lifestyle modification.  Right start program at [facility] well was discussed and one year f/u was recommended.   Seen by me on 12/13/23 with concerns about abnormal screening ABIs and leg pain. Reported left leg pain down the side of her leg worse after sitting or sleeping, no pain with walking. Also had elevated CRP.  Significant palpitations during her daughter's emergency C-section with elevated HR to 159 bpm per smart watch. BP has been well-controlled at home but elevated during office visits. Her LDL was 95 in October and she has an A1c of 6.2, diet-controlled. Family history includes atrial fibrillation and stroke, with her mother and sister  affected.  She travels frequently and is concerned about blood clots during flights. She has no symptoms of sleep apnea but occasionally snores. EKG revealed new Q wave in leads V3-4, so coronary CTA was ordered for ischemia evaluation.   CT revealed concern for significant LAD stenosis with 70-99% proximal and 50-69% mid-segment stenosis with reduced FFR. She underwent LHC 01/10/24 revealed patent coronary arteries with minimal nonobstructive plaquing, no significant stenosis, normal LVEDP.        History of Present Illness: .     History of Present Illness [name] is a very pleasant 58 year old female who presents today for follow-up of CAD. She reports significant anxiety following the results of the abnormal coronary CTA with subsequent LHC that revealed only minimal coronary artery disease. She was told prior to catheterization that there was potential for open heart surgery. We discussed the potential reasons for the differences in the tests and she is relieved that there is no obstructive CAD. She has been reluctant to engage in normal activities, but is leaving for [place] in a few days for vacation. She experiences an elevated heart rate of 100-120 bpm with minimal activity, such as walking up stairs. She is curious about the possibility of POTS, although she does not experience syncope upon standing. She monitors her heart rate using a Fitbit. Recent lab testing was incomplete due to insufficient sample volume for lipoprotein A. She is not having any side effects from increased dose of rosuvastatin . She denies chest pain, shortness of breath, edema, fatigue, palpitations,  presyncope, syncope, orthopnea, and PND. Has  right wrist pain at site of cath.   Discussed the use of AI scribe software for clinical note transcription with the patient, who gave verbal consent to proceed.   ROS: + right wrist pain       Studies Reviewed: Judy [place]         Lipoprotein (a)  Date/Time Value Ref Range  Status  01/09/2024 02:09 PM CANCELED nmol/L     Comment:    [facility] was unable to collect sufficient specimen to perform the following test(s), and is providing the patient with re-collection instructions. Note:  Values greater than or equal to 75.0 nmol/L may        indicate an independent risk factor for CHD,        but must be evaluated with caution when applied        to non-Caucasian populations due to the        influence of genetic factors on Lp(a) across        ethnicities.  Result canceled by the ancillary.      Risk Assessment/Calculations:             Physical Exam:   VS:  BP 132/76 (BP Location: Left Arm, Patient Position: Sitting, Cuff Size: Large)   Pulse 89   Ht 5\' 5"  (1.651 m)   Wt 189 lb (85.7 kg)   LMP 01/13/2006   SpO2 90%   BMI 31.45 kg/m    Wt Readings from Last 3 Encounters:  01/15/24 189 lb (85.7 kg)  01/10/24 188 lb (85.3 kg)  01/09/24 188 lb 12.8 oz (85.6 kg)    GEN: Well nourished, well developed in no acute distress NECK: No JVD; No carotid bruits CARDIAC: RRR, no murmurs, rubs, gallops RESPIRATORY:  Clear to auscultation without rales, wheezing or rhonchi  ABDOMEN: Soft, non-tender, non-distended EXTREMITIES:  No edema; Mild bruising and tenderness rt wrist    ASSESSMENT AND PLAN: .    Assessment & Plan Coronary artery disease without angina Coronary CTA 01/04/24 revealed severe stenosis 70 to 99% ostial LAD with CT FFR  showing high likelihood of hemodynamic significance.  She was subsequently scheduled for LHC on 01/10/24 which revealed minimal CAD. We discussed extraluminal versus intraluminal calcification. Emphasized the importance of secondary prevention with LDL goal < 70 mg/dL along with heart healthy mostly plant based diet avoiding saturated fat, processed foods, simple carbohydrates, and sugar along with aiming for at least 150 minutes of moderate intensity exercise each week. We will check lipoprotein A levels  and NMR prior to  appointment in 2 months. Continue aspirin  therapy.   Hypertension   BP is well controlled. Office readings are generally higher than those taken at home. Renal function stable on recent labs. No change in anti-hypertensive therapy today.   Hyperlipidemia LDL goal < 70 Lipid panel completed 05/18/23 with total cholesterol 159, triglycerides 81, HDL 49, LDL-C 95. LDL goal < 70 due to coronary calcification. Rosuvastatin  was increased to 20 mg daily approximately 1 week ago.  We will recheck fasting lipid panel along with LP(a) and ALT in 2 months.  Continue rosuvastatin  along with healthy diet and regular exercise.    Tachycardia/Palpitations    She reports resting heart rate generally around 100 bpm with elevations into the 120s with minimal exertion.  We discussed consideration of cardiac monitor.  She will monitor with Fitbit.  Encouraged gradual increase in exercise to potentially improve cardiorespiratory function. Consider low dose beta blocker if HR remains consistently elevated.  Disposition: Keep your August appointment with me  Signed, Slater Duncan, NP-C

## 2024-03-22 LAB — HEPATIC FUNCTION PANEL
ALT: 36 IU/L — ABNORMAL HIGH (ref 0–32)
AST: 26 IU/L (ref 0–40)
Albumin: 4.5 g/dL (ref 3.8–4.9)
Alkaline Phosphatase: 81 IU/L (ref 44–121)
Bilirubin Total: 0.5 mg/dL (ref 0.0–1.2)
Bilirubin, Direct: 0.16 mg/dL (ref 0.00–0.40)
Total Protein: 6.5 g/dL (ref 6.0–8.5)

## 2024-03-22 LAB — LIPOPROTEIN A (LPA): Lipoprotein (a): 106.2 nmol/L — ABNORMAL HIGH (ref <75.0)

## 2024-03-23 LAB — NMR, LIPOPROFILE
Cholesterol, Total: 153 mg/dL (ref 100–199)
HDL Particle Number: 34.7 umol/L (ref >=30.5)
HDL-C: 51 mg/dL (ref >39)
LDL Particle Number: 1188 nmol/L — ABNORMAL HIGH (ref <1000)
LDL Size: 20.6 nm (ref >20.5)
LDL-C (NIH Calc): 83 mg/dL (ref 0–99)
LP-IR Score: 31 (ref <=45)
Small LDL Particle Number: 485 nmol/L (ref <=527)
Triglycerides: 106 mg/dL (ref 0–149)

## 2024-03-25 ENCOUNTER — Encounter (HOSPITAL_BASED_OUTPATIENT_CLINIC_OR_DEPARTMENT_OTHER): Payer: Self-pay | Admitting: Nurse Practitioner

## 2024-03-25 ENCOUNTER — Ambulatory Visit: Payer: Self-pay | Admitting: Nurse Practitioner

## 2024-03-25 ENCOUNTER — Ambulatory Visit (HOSPITAL_BASED_OUTPATIENT_CLINIC_OR_DEPARTMENT_OTHER): Admitting: Nurse Practitioner

## 2024-03-25 VITALS — BP 144/76 | HR 84 | Ht 65.0 in | Wt 189.0 lb

## 2024-03-25 DIAGNOSIS — R931 Abnormal findings on diagnostic imaging of heart and coronary circulation: Secondary | ICD-10-CM | POA: Diagnosis not present

## 2024-03-25 DIAGNOSIS — I10 Essential (primary) hypertension: Secondary | ICD-10-CM

## 2024-03-25 DIAGNOSIS — I2510 Atherosclerotic heart disease of native coronary artery without angina pectoris: Secondary | ICD-10-CM | POA: Diagnosis not present

## 2024-03-25 DIAGNOSIS — E6609 Other obesity due to excess calories: Secondary | ICD-10-CM

## 2024-03-25 DIAGNOSIS — E7841 Elevated Lipoprotein(a): Secondary | ICD-10-CM

## 2024-03-25 DIAGNOSIS — Z6831 Body mass index (BMI) 31.0-31.9, adult: Secondary | ICD-10-CM

## 2024-03-25 DIAGNOSIS — E785 Hyperlipidemia, unspecified: Secondary | ICD-10-CM | POA: Diagnosis not present

## 2024-03-25 DIAGNOSIS — K760 Fatty (change of) liver, not elsewhere classified: Secondary | ICD-10-CM

## 2024-03-25 DIAGNOSIS — E66811 Obesity, class 1: Secondary | ICD-10-CM

## 2024-03-25 DIAGNOSIS — K219 Gastro-esophageal reflux disease without esophagitis: Secondary | ICD-10-CM

## 2024-03-25 DIAGNOSIS — R748 Abnormal levels of other serum enzymes: Secondary | ICD-10-CM

## 2024-03-25 MED ORDER — ROSUVASTATIN CALCIUM 10 MG PO TABS: 10.0000 mg | ORAL_TABLET | Freq: Every day | ORAL | 3 refills | Status: AC

## 2024-03-25 NOTE — Progress Notes (Signed)
 Cardiology Office Note:  .   Date:  03/26/2024  ID:  Judy Jones, DOB 1966-06-12, MRN [id number] PCP: Judy Lenis, MD  [facility] Providers Cardiologist:  Judy Scarce, MD    Patient Profile: .      PMH Hypertension Hyperlipidemia GERD Anxiety Diet controlled diabetes Palpitations Diffuse hepatic steatosis Arthritis Family history of heart disease Mother - atrial fibrillation Sister - atrial fibrillation and stroke  She established care in 2022 for palpitations and tachycardia which improved when prior stressors improved. BP was well-controlled on losartan and hydrochlorothiazide at that time.  Nuclear stress test 04/2020 with LVEF 80% and no ischemia.  Seen 10/28/2022 by Judy Finder, NP for independent follow-up.  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 Palpitations  well controlled, only noted with increased caffeine or stress.  She was interested in being more active and remains understandably concerned about palpitations as both her mother and sister have atrial fibrillation.  She does wear a smart watch that has not alerted to A-fib.  Most recent LDL was 96 which is at goal of <100.  Advised that if palpitations worsened would plan for ZIO monitor.  BMI was 32 and she was encouraged to work on weight loss via lifestyle modification.  Right start program at [facility] was discussed and one year f/u was recommended.   Seen by me on 12/13/23 with concerns about abnormal screening ABIs and leg pain. Reported left leg pain down the side of her leg worse after sitting or sleeping, no pain with walking. Also had elevated CRP.  Significant palpitations during her daughter's emergency C-section with elevated HR to 159 bpm per smart watch. BP has been well-controlled at home but elevated during office visits. Her LDL was 95 in October and she has an A1c of 6.2, diet-controlled. Family history includes atrial fibrillation and stroke, with  her mother and sister affected.  She travels frequently and is concerned about blood clots during flights. She has no symptoms of sleep apnea but occasionally snores. EKG revealed new Q wave in leads V3-4, so coronary CTA was ordered for ischemia evaluation.   CT revealed concern for significant LAD stenosis with 70-99% proximal and 50-69% mid-segment stenosis with reduced FFR. She underwent LHC 01/10/24 revealed patent coronary arteries with minimal nonobstructive plaquing, no significant stenosis, normal LVEDP.   Seen in follow-up 01/15/24 reporting significant anxiety following the results of the abnormal coronary CTA with subsequent LHC that revealed only minimal coronary artery disease. Was told prior to catheterization that there was potential for open heart surgery. We discussed the potential reasons for the differences in the tests and she is relieved that there is no obstructive CAD. She has been reluctant to engage in normal activities, but is leaving for [place] in a few days for vacation. She experiences an elevated heart rate of 100-120 bpm with minimal activity, such as walking up stairs. She is curious about the possibility of POTS, although she does not experience syncope upon standing. She monitors her heart rate using a Fitbit. We considered cardiac monitor, but she will follow HR trends with her FitBit. Recent lab testing was incomplete due to insufficient sample volume for lipoprotein A. She is not having any side effects from increased dose of rosuvastatin . No chest pain, shortness of breath, edema, fatigue, palpitations,  presyncope, syncope, orthopnea, and PND. Has right wrist pain at site of cath. Plan to undergo further lipid testing in 2 months with follow-up afterward.  History of Present Illness: .     History of Present Illness Judy Jones is a very pleasant 58 year old female who presents today for follow-up of CAD. She is concerned about mildly elevated lipoprotein  A and elevated LDL particle number, with a recent LDL level of 83, down from 95. She was advised to start rosuvastatin  20 mg but reduced her dose to 10 mg daily due to concerns about potential kidney and liver effects. Her family history includes chronic kidney disease in her mother. Liver enzymes have been slightly elevated in the past, with ALT levels ranging from 33 to 54. History of fatty liver, noted during an ultrasound for kidney pain and UTIs. She is working on improving her diet and increasing physical activity. She denies chest pain, shortness of breath, palpitations, orthopnea, PND, presyncope or syncope. She has a history of panic attacks, initially leading to concerns about a heart attack.  She is also concerned about long-term Nexium use and is planning on weaning herself off with the use of digestive enzymes.   Discussed the use of AI scribe software for clinical note transcription with the patient, who gave verbal consent to proceed.   ROS: See HPI       Studies Reviewed: SABRA         Lipoprotein (a)  Date/Time Value Ref Range Status  03/21/2024 10:01 AM 106.2 (H) <75.0 nmol/L Final    Comment:    Note:  Values greater than or equal to 75.0 nmol/L may        indicate an independent risk factor for CHD,        but must be evaluated with caution when applied        to non-Caucasian populations due to the        influence of genetic factors on Lp(a) across        ethnicities.      Risk Assessment/Calculations:             Physical Exam:   VS:  BP 134/74   Pulse 84   Ht 5' 5 (1.651 m)   Wt 189 lb (85.7 kg)   LMP 01/13/2006   SpO2 99%   BMI 31.45 kg/m    Wt Readings from Last 3 Encounters:  03/25/24 189 lb (85.7 kg)  01/15/24 189 lb (85.7 kg)  01/10/24 188 lb (85.3 kg)    GEN: Well nourished, well developed in no acute distress NECK: No JVD; No carotid bruits CARDIAC: RRR, no murmurs, rubs, gallops RESPIRATORY:  Clear to auscultation without rales, wheezing or  rhonchi  ABDOMEN: Soft, non-tender, non-distended EXTREMITIES:  No edema; Mild bruising and tenderness rt wrist    ASSESSMENT AND PLAN: .    Assessment & Plan Coronary artery disease without angina Coronary CTA 01/04/24 revealed severe stenosis 70 to 99% ostial LAD with CT FFR  showing high likelihood of hemodynamic significance. LHC on 01/10/24 revealed minimal CAD. We discussed extraluminal versus intraluminal calcification. She denies chest pain, dyspnea, or other symptoms concerning for angina.  No indication for further ischemic evaluation at this time.  She feels her heart rate which was typically > 100 bpm has improved recently. -Emphasized the importance of secondary prevention with LDL goal < 70 mg/dL  -Heart healthy, whole food diet limiting saturated fat, processed foods, simple carbohydrates, and sugar a -Healthy lifestyle maintaining regular physical activity along with aiming for at least 150 minutes of moderate intensity exercise each week -Continue aspirin    Hypertension  BP initially elevated but improved on my recheck.  Office readings generally higher indicating whitecoat hypertension. No change in anti-hypertensive therapy today.  Renal function stable on labs completed 01/09/2024. 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 NMR lipid panel completed 03/21/2024 with particle #[id number], LDL-C 83, HDL-C 51, triglycerides 106, and small LDL-P 485. LDL improved from 95 but not at goal of < 70. She is concerned about taking rosuvastatin  20 mg daily due to potential liver damage.  She has been diagnosed with fatty liver and has mildly elevated ALT. We discussed further risk with mildly elevated LP(a), however she wishes to work on lifestyle modification prior to adding additional lipid lowering agent.  -Encouraged dietary modifications and increased exercise for potential further improvement in LDL and HDL -Continue rosuvastatin  10 mg  daily    Fatty liver disease  Mildly elevated liver enzymes Obesity   Liver enzymes are mildly elevated, with ALT currently at 36. Past elevations in the 50s. Prior renal u/s revealed fatty liver. Concerns about the potential impact of long-term Nexium use and statin therapy on liver function.  -She prefers to take rosuvastatin  10 mg daily which is reasonable -Emphasized healthy diet limiting sugar, saturated fat, processed food, and simple carbohydrates -Encouraged regular exercise and weight loss to manage metabolic dysfunction  Gastroesophageal reflux disease   Long-term Nexium use raises concerns about liver function and gut health. She plans to transition to natural acid reducers and gradually wean off Nexium. - Recommended Pepcid as an alternative acid reducer during the transition -See GI or functional medicine provider for additional guidance/treatment of GERD        Disposition: 1 year with Dr. Raford or APP  Signed, Rosaline Bane, NP-C

## 2024-03-25 NOTE — Patient Instructions (Signed)
 Medication Instructions:   START Rosuvastatin  one (1) tablet by ( 10 mg) daily.   *If you need a refill on your cardiac medications before your next appointment, please call your pharmacy*  Lab Work:  None ordered.  If you have labs (blood work) drawn today and your tests are completely normal, you will receive your results only by: MyChart Message (if you have MyChart) OR A paper copy in the mail If you have any lab test that is abnormal or we need to change your treatment, we will call you to review the results.  Testing/Procedures:   None ordered.   Follow-Up: At [facility], you and your health needs are our priority.  As part of our continuing mission to provide you with exceptional heart care, our providers are all part of one team.  This team includes your primary Cardiologist (physician) and Advanced Practice Providers or APPs (Physician Assistants and Nurse Practitioners) who all work together to provide you with the care you need, when you need it.  Your next appointment:   1 year(s)  Provider:   Annabella Scarce, MD, Rosaline Bane, NP, or Reche Finder, NP    We recommend signing up for the patient portal called MyChart.  Sign up information is provided on this After Visit Summary.  MyChart is used to connect with patients for Virtual Visits (Telemedicine).  Patients are able to view lab/test results, encounter notes, upcoming appointments, etc.  Non-urgent messages can be sent to your provider as well.   To learn more about what you can do with MyChart, go to [email or web address].   Other Instructions  Your physician wants you to follow-up in: 1 year.  You will receive a reminder letter in the mail two months in advance. If you don't receive a letter, please call our office to schedule the follow-up appointment.

## 2024-03-26 ENCOUNTER — Encounter (HOSPITAL_BASED_OUTPATIENT_CLINIC_OR_DEPARTMENT_OTHER): Payer: Self-pay | Admitting: Nurse Practitioner

## 2024-05-01 ENCOUNTER — Other Ambulatory Visit: Payer: Self-pay | Admitting: Obstetrics and Gynecology

## 2024-05-01 DIAGNOSIS — Z1231 Encounter for screening mammogram for malignant neoplasm of breast: Secondary | ICD-10-CM

## 2024-05-10 ENCOUNTER — Ambulatory Visit
Admission: RE | Admit: 2024-05-10 | Discharge: 2024-05-10 | Disposition: A | Discharge status: Home / Self Care | Source: Ambulatory Visit | Attending: Obstetrics and Gynecology | Admitting: Obstetrics and Gynecology

## 2024-05-10 DIAGNOSIS — Z1231 Encounter for screening mammogram for malignant neoplasm of breast: Secondary | ICD-10-CM

## 2024-05-15 ENCOUNTER — Ambulatory Visit: Payer: Self-pay | Admitting: Obstetrics and Gynecology

## 2024-05-16 ENCOUNTER — Other Ambulatory Visit: Payer: Self-pay | Admitting: Family Medicine

## 2024-05-16 DIAGNOSIS — R928 Other abnormal and inconclusive findings on diagnostic imaging of breast: Secondary | ICD-10-CM

## 2024-05-22 ENCOUNTER — Ambulatory Visit
Admission: RE | Admit: 2024-05-22 | Discharge: 2024-05-22 | Disposition: A | Discharge status: Home / Self Care | Source: Ambulatory Visit | Attending: Family Medicine | Admitting: Family Medicine

## 2024-05-22 ENCOUNTER — Other Ambulatory Visit

## 2024-05-22 ENCOUNTER — Other Ambulatory Visit: Payer: Self-pay | Admitting: Family Medicine

## 2024-05-22 ENCOUNTER — Encounter

## 2024-05-22 ENCOUNTER — Ambulatory Visit

## 2024-05-22 DIAGNOSIS — R928 Other abnormal and inconclusive findings on diagnostic imaging of breast: Secondary | ICD-10-CM

## 2024-05-22 DIAGNOSIS — R921 Mammographic calcification found on diagnostic imaging of breast: Secondary | ICD-10-CM

## 2024-05-27 ENCOUNTER — Encounter: Payer: Self-pay | Admitting: Obstetrics and Gynecology

## 2024-05-27 ENCOUNTER — Ambulatory Visit: Payer: 59 | Admitting: Obstetrics and Gynecology

## 2024-05-27 VITALS — BP 132/84 | HR 95 | Ht 64.75 in | Wt 188.0 lb

## 2024-05-27 DIAGNOSIS — Z01419 Encounter for gynecological examination (general) (routine) without abnormal findings: Secondary | ICD-10-CM

## 2024-05-27 DIAGNOSIS — Z1331 Encounter for screening for depression: Secondary | ICD-10-CM | POA: Diagnosis not present

## 2024-05-27 MED ORDER — NYSTATIN 100000 UNIT/GM EX POWD: 1.0000 | Freq: Three times a day (TID) | CUTANEOUS | 3 refills | Status: AC

## 2024-05-27 NOTE — Patient Instructions (Signed)

## 2024-05-27 NOTE — Progress Notes (Signed)
 58 y.o. G8P2002 Married Caucasian female here for annual exam.    Had extensive cardiac work up.  No surgery is needed.   Has a breast bx this week.   Has a 32 month old grand daughter.   PCP: Seabron Lenis, MD   Patient's last menstrual period was 01/13/2006.           Sexually active: Yes.    The current method of family planning is status post hysterectomy.    Menopausal hormone therapy:  n/a Exercising: No.   Smoker:  no  OB History  Gravida Para Term Preterm AB Living  2 2 2   2   SAB IAB Ectopic Multiple Live Births          # Outcome Date GA Lbr Len/2nd Weight Sex Type Anes PTL Lv  2 Term           1 Term              HEALTH MAINTENANCE: Last 2 paps:  06/28/11 neg History of abnormal Pap or positive HPV:  no Mammogram:   05/22/24 R Breast- Breast Density Cat B, BIRADS Cat 4 sus  Colonoscopy:  10/23/20 - due in 2032.  Bone Density:  n/a  Result  n/a   Immunization History  Administered Date(s) Administered   PFIZER(Purple Top)SARS-COV-2 Vaccination 09/06/2019, 09/27/2019, 05/15/2020, 01/22/2021      reports that she has never smoked. She has never used smokeless tobacco. She reports that she does not currently use alcohol after a past usage of about 1.0 standard drink of alcohol per week. She reports that she does not use drugs.  Past Medical History:  Diagnosis Date   ALLERGIC RHINITIS 05/30/2008   Qualifier: Diagnosis of  By: Geronimo MD, Murali     Allergy    Anxiety    Arthritis    Cancer (HCC) 07/2018   Basal cell--nose   Cataract    removed both    Chest tightness 04/06/2020   Complication of anesthesia    Cough 05/30/2008   Qualifier: Diagnosis of  By: Geronimo MD, Murali     Diet-controlled diabetes mellitus (HCC) 04/06/2020   Elevated hemoglobin A1c 2018   Essential hypertension 04/06/2020   Family history of anesthesia complication    Mother get sick on her stomach   Genuine stress incontinence, female 04/23/2014   GERD (gastroesophageal  reflux disease)    Hypertension    IBS (irritable bowel syndrome)    Overweight 04/06/2020   PONV (postoperative nausea and vomiting)    Shortness of breath 02/26/2021   Tachycardia 02/26/2021   Wears glasses     Past Surgical History:  Procedure Laterality Date   BREAST BIOPSY Left 06/2019   BREAST BIOPSY Right 02/04/2023   MM RT BREAST BX W LOC DEV 1ST LESION IMAGE BX SPEC STEREO GUIDE 02/04/2023 [facility] MAMMOGRAP[facility]   CARPAL TUNNEL RELEASE Left 10/24/2014   Procedure: LEFT CARPAL TUNNEL ;  Surgeon: Elsie Mussel, MD;  Location: [facility];  Service: Orthopedics;  Laterality: Left;   CATARACT EXTRACTION, BILATERAL     CESAREAN SECTION  1985   CHOLECYSTECTOMY  1991   COLONOSCOPY     Hx: of   EYE SURGERY  2013   both cataracts   LEFT HEART CATH AND CORONARY ANGIOGRAPHY N/A 01/10/2024   Procedure: LEFT HEART CATH AND CORONARY ANGIOGRAPHY;  Surgeon: Wonda Sharper, MD;  Location: [facility] INVASIVE CV LAB;  Service: Cardiovascular;  Laterality: N/A;   MOHS SURGERY  07/2018  nose--basal cell   right leg at knee   ULNAR NERVE TRANSPOSITION Right 08/01/2013   Procedure: RIGHT LIMITED CARPAL TUNNEL RELEASE,RIGHT CUBITAL TUNNEL RELEASE; right cubital tunnel release INSITU ,EXCISION RIGHT MIDDLE FINGER MASS;  Surgeon: Elsie Mussel, MD;  Location: [facility] OR;  Service: Orthopedics;  Laterality: Right;   ULNAR NERVE TRANSPOSITION Left 10/24/2014   Procedure: ULNAR NERVE DECOMPRESSION AT ELBOW;  Surgeon: Elsie Mussel, MD;  Location: [facility];  Service: Orthopedics;  Laterality: Left;   VAGINAL HYSTERECTOMY  2007   ant/post repair   WISDOM TOOTH EXTRACTION      Current Outpatient Medications  Medication Sig Dispense Refill   ALPRAZolam (XANAX) 0.25 MG tablet Take 0.25 mg by mouth daily as needed for anxiety.     aspirin  EC 81 MG tablet Take 81 mg by mouth daily. Swallow whole.     Cholecalciferol (VITAMIN D ) 2000 UNITS CAPS Take 2,000 Units by mouth daily.      esomeprazole (NEXIUM) 40 MG capsule Take 40 mg by mouth every evening.     fluticasone (FLONASE) 50 MCG/ACT nasal spray Place 1 spray into both nostrils daily as needed for allergies.     loratadine (CLARITIN) 10 MG tablet Take 10 mg by mouth daily as needed for allergies.     losartan-hydrochlorothiazide (HYZAAR) 100-12.5 MG per tablet Take 1 tablet by mouth daily.      Magnesium 250 MG TABS Take 250 mg by mouth at bedtime. gummy     nitroGLYCERIN  (NITROSTAT ) 0.4 MG SL tablet Place 1 tablet (0.4 mg total) under the tongue every 5 (five) minutes as needed for chest pain. 25 tablet 3   ondansetron  (ZOFRAN ) 4 MG tablet Take 4 mg by mouth daily as needed for nausea or vomiting.     Perfluorohexyloctane (MIEBO) 1.338 GM/ML SOLN Place 1 drop into both eyes 4 (four) times daily.     Propylene Glycol, PF, (SYSTANE COMPLETE PF) 0.6 % SOLN Place 1 drop into both eyes 4 (four) times daily.     rosuvastatin  (CRESTOR ) 10 MG tablet Take 1 tablet (10 mg total) by mouth daily. 90 tablet 3   Current Facility-Administered Medications  Medication Dose Route Frequency Provider Last Rate Last Admin   0.9 %  sodium chloride  infusion  500 mL Intravenous Continuous Mansouraty, Aloha Raddle., MD        ALLERGIES: Shellfish allergy, Shrimp (diagnostic), Morphine, Morphine sulfate, Other, Penicillin v, Penicillins, Percocet [oxycodone-acetaminophen], Clarithromycin, and Flagyl  [metronidazole ]  Family History  Problem Relation Age of Onset   Hypertension Mother    Atrial fibrillation Mother    Kidney disease Mother    Alzheimer's disease Father    Atrial fibrillation Sister    Stroke Sister    Bipolar disorder Other    Colon cancer Neg Hx    Colon polyps Neg Hx    Esophageal cancer Neg Hx    Rectal cancer Neg Hx    Stomach cancer Neg Hx     Review of Systems  All other systems reviewed and are negative.   PHYSICAL EXAM:  BP 132/84 (BP Location: Left Arm, Patient Position: Sitting)   Pulse 95   Ht 5'  4.75 (1.645 m)   Wt 188 lb (85.3 kg)   LMP 01/13/2006   SpO2 98%   BMI 31.53 kg/m     General appearance: alert, cooperative and appears stated age Head: normocephalic, without obvious abnormality, atraumatic Neck: no adenopathy, supple, symmetrical, trachea midline and thyroid normal to inspection and palpation Lungs: clear to  auscultation bilaterally Breasts: normal appearance, no masses or tenderness, No nipple retraction or dimpling, No nipple discharge or bleeding, No axillary adenopathy Heart: regular rate and rhythm Abdomen: soft, non-tender; no masses, no organomegaly Extremities: extremities normal, atraumatic, no cyanosis or edema Skin: skin color, texture, turgor normal. No rashes or lesions Lymph nodes: cervical, supraclavicular, and axillary nodes normal. Neurologic: grossly normal  Pelvic: External genitalia:  no lesions              No abnormal inguinal nodes palpated.              Urethra:  normal appearing urethra with no masses, tenderness or lesions              Bartholins and Skenes: normal                 Vagina: normal appearing vagina with normal color and discharge, no lesions              Cervix:  absent              Pap taken:  no. Bimanual Exam:  Uterus:  absent.  Scar tissue at vaginal apex.                Adnexa: no mass, fullness, tenderness              Rectal exam: yes.  Confirms.              Anus:  normal sphincter tone, no lesions  Chaperone was present for exam:  Kari HERO, CMA  ASSESSMENT: Well woman visit with gynecologic exam. Status post TVH and anterior and posterior colporrhaphy.  Ovaries remain. Vaginal atrophy.  Hx Candida of flexural folds of skin. Prediabetic.  FH MTHFR.  FH stroke.  PHQ-2-9: 1  PLAN: Mammogram screening discussed. Self breast awareness reviewed. Pap and HRV collected:  no.  Not indicated.  Guidelines for Calcium , Vitamin D , regular exercise program including cardiovascular and weight bearing  exercise. Medication refills:  Nystatin  powder.  We dicussed treatments for vaginal atrophy:  vaginal estradiol  cream or tablets, vaginal vit E.  Lubricants also discussed.  Labs with PCP and cardiology.  Follow up:  yearly and prn.

## 2024-05-29 ENCOUNTER — Other Ambulatory Visit (HOSPITAL_COMMUNITY): Payer: Self-pay | Admitting: Diagnostic Radiology

## 2024-05-29 ENCOUNTER — Ambulatory Visit
Admission: RE | Admit: 2024-05-29 | Discharge: 2024-05-29 | Disposition: A | Discharge status: Home / Self Care | Source: Ambulatory Visit | Attending: Family Medicine | Admitting: Family Medicine

## 2024-05-29 DIAGNOSIS — R921 Mammographic calcification found on diagnostic imaging of breast: Secondary | ICD-10-CM

## 2024-05-29 DIAGNOSIS — R928 Other abnormal and inconclusive findings on diagnostic imaging of breast: Secondary | ICD-10-CM

## 2024-05-29 HISTORY — PX: BREAST BIOPSY: SHX20

## 2024-05-30 LAB — SURGICAL PATHOLOGY

## 2025-06-16 ENCOUNTER — Ambulatory Visit: Payer: Self-pay | Admitting: Obstetrics and Gynecology
